# Patient Record
Sex: Male | Born: 1990
Health system: Southern US, Community
[De-identification: ages and names within clinical notes are randomized; demographics above are authoritative.]

## PROBLEM LIST (undated history)

## (undated) DIAGNOSIS — J9819 Other pulmonary collapse: Secondary | ICD-10-CM

## (undated) HISTORY — PX: CHEST TUBE INSERTION: SHX231

---
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
## 2008-05-07 ENCOUNTER — Emergency Department (HOSPITAL_BASED_OUTPATIENT_CLINIC_OR_DEPARTMENT_OTHER): Admission: EM | Admit: 2008-05-07 | Discharge: 2008-05-08 | Payer: Self-pay | Admitting: Emergency Medicine

## 2008-05-08 ENCOUNTER — Ambulatory Visit: Payer: Self-pay | Admitting: Thoracic Surgery

## 2008-05-09 ENCOUNTER — Ambulatory Visit: Payer: Self-pay | Admitting: Cardiothoracic Surgery

## 2008-05-09 ENCOUNTER — Ambulatory Visit (HOSPITAL_COMMUNITY): Admission: RE | Admit: 2008-05-09 | Discharge: 2008-05-09 | Payer: Self-pay | Admitting: Cardiothoracic Surgery

## 2008-05-16 ENCOUNTER — Ambulatory Visit: Payer: Self-pay | Admitting: Cardiothoracic Surgery

## 2008-05-16 ENCOUNTER — Ambulatory Visit (HOSPITAL_COMMUNITY): Admission: RE | Admit: 2008-05-16 | Discharge: 2008-05-16 | Payer: Self-pay | Admitting: Cardiothoracic Surgery

## 2008-09-15 ENCOUNTER — Encounter: Payer: Self-pay | Admitting: Emergency Medicine

## 2008-09-15 ENCOUNTER — Inpatient Hospital Stay (HOSPITAL_COMMUNITY): Admission: EM | Admit: 2008-09-15 | Discharge: 2008-09-23 | Payer: Self-pay | Admitting: *Deleted

## 2008-09-15 ENCOUNTER — Ambulatory Visit: Payer: Self-pay | Admitting: Diagnostic Radiology

## 2008-09-15 ENCOUNTER — Ambulatory Visit: Payer: Self-pay | Admitting: Thoracic Surgery

## 2008-09-19 ENCOUNTER — Encounter: Payer: Self-pay | Admitting: Thoracic Surgery

## 2008-10-03 ENCOUNTER — Ambulatory Visit: Payer: Self-pay | Admitting: Thoracic Surgery

## 2008-10-03 ENCOUNTER — Encounter: Admission: RE | Admit: 2008-10-03 | Discharge: 2008-10-03 | Payer: Self-pay | Admitting: Thoracic Surgery

## 2009-01-26 ENCOUNTER — Emergency Department (HOSPITAL_BASED_OUTPATIENT_CLINIC_OR_DEPARTMENT_OTHER): Admission: EM | Admit: 2009-01-26 | Discharge: 2009-01-27 | Payer: Self-pay | Admitting: Emergency Medicine

## 2009-01-26 ENCOUNTER — Ambulatory Visit: Payer: Self-pay | Admitting: Diagnostic Radiology

## 2009-02-22 ENCOUNTER — Emergency Department (HOSPITAL_BASED_OUTPATIENT_CLINIC_OR_DEPARTMENT_OTHER): Admission: EM | Admit: 2009-02-22 | Discharge: 2009-02-23 | Payer: Self-pay | Admitting: Emergency Medicine

## 2010-01-02 IMAGING — CR DG CHEST 1V PORT
1 series · 1 of 1 positions shown · non-contrast
Comparison: 09/15/2008

CLINICAL DATA: Transfer from [HOSPITAL] [HOSPITAL].  Status post CT
placement

PORTABLE CHEST - 1 VIEW

[AP]
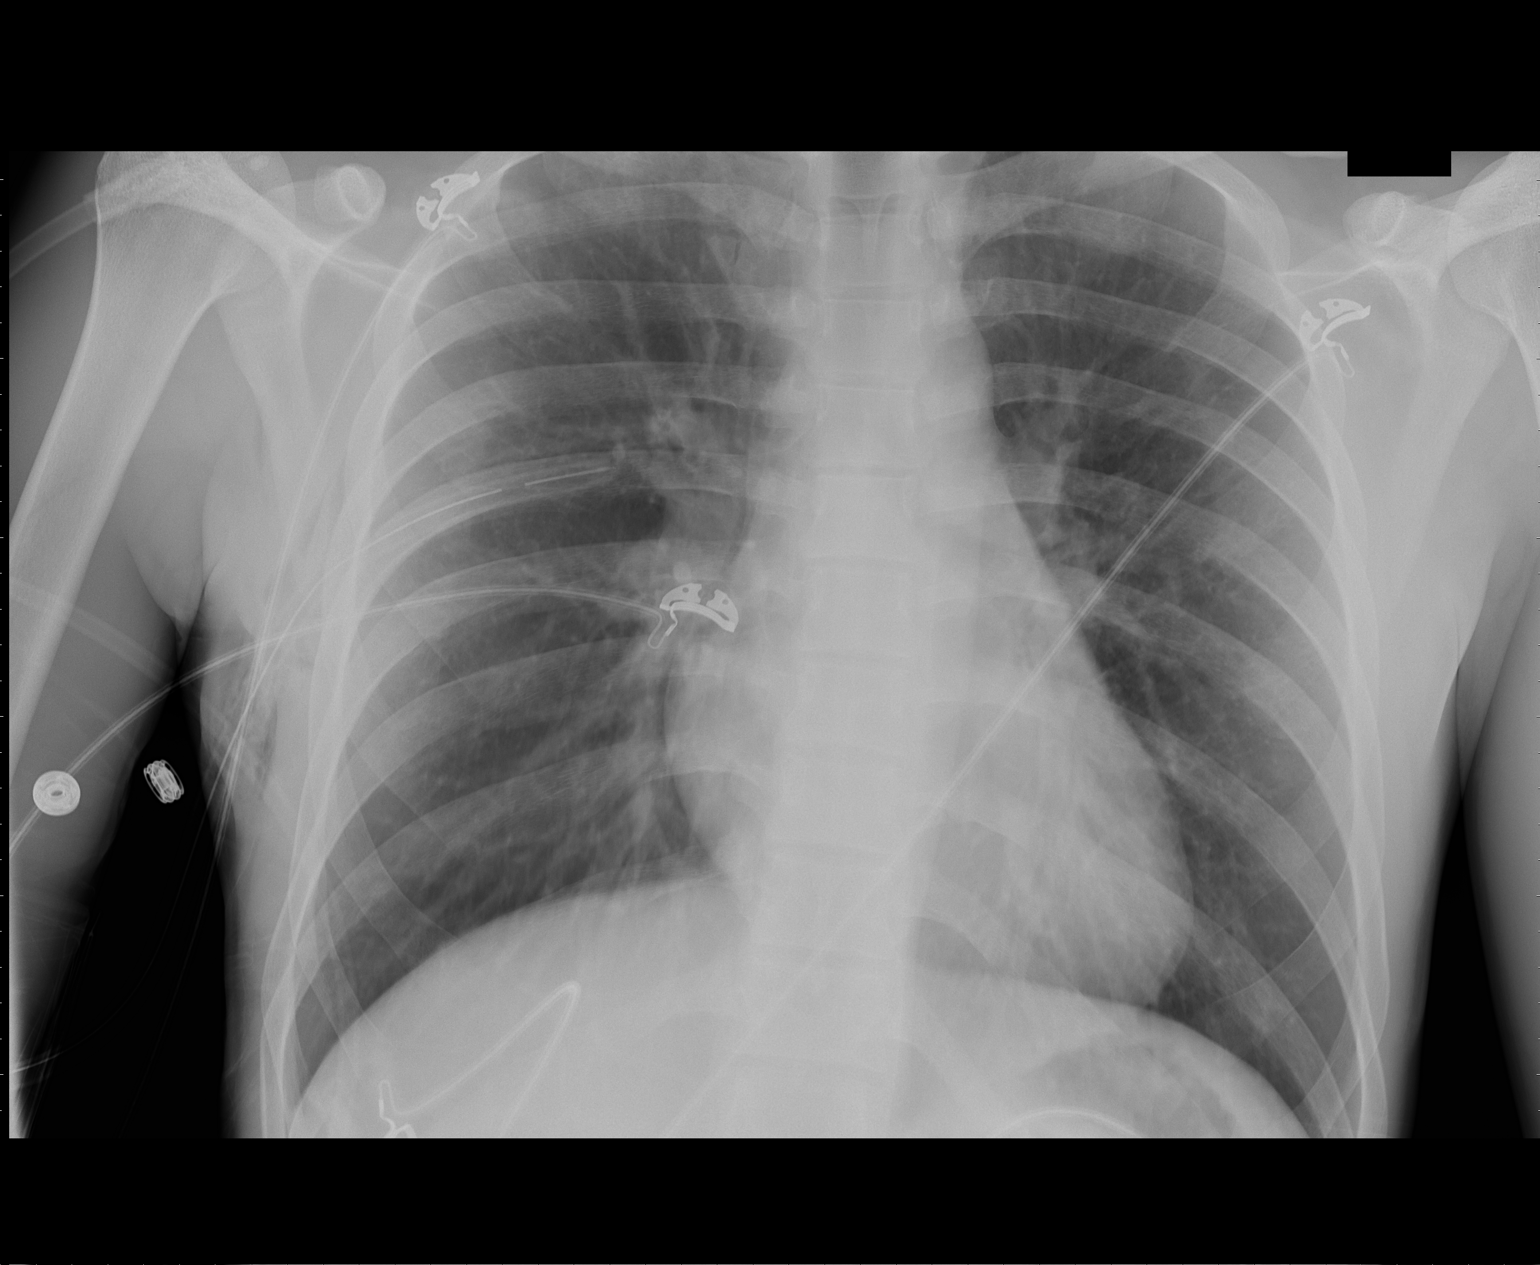

[1 of 1 positions shown; findings below may reference images not displayed]

FINDINGS: The patient has a right-sided chest tube.  There is
patient motion artifact.  Right pneumothorax is much smaller, now
estimated to measure less than 5% lung volume.  Heart size is
normal.  Left lung is essentially clear.
IMPRESSION: Interval placement of right-sided chest tube.  Small right
pneumothorax.

## 2010-01-09 IMAGING — CR DG CHEST 2V
2 series · 2 of 2 positions shown · non-contrast
Comparison: Portable chest x-ray of 09/21/2008

CLINICAL DATA: Spontaneous pneumothorax, follow-up

CHEST - 2 VIEW

[w chest pa]
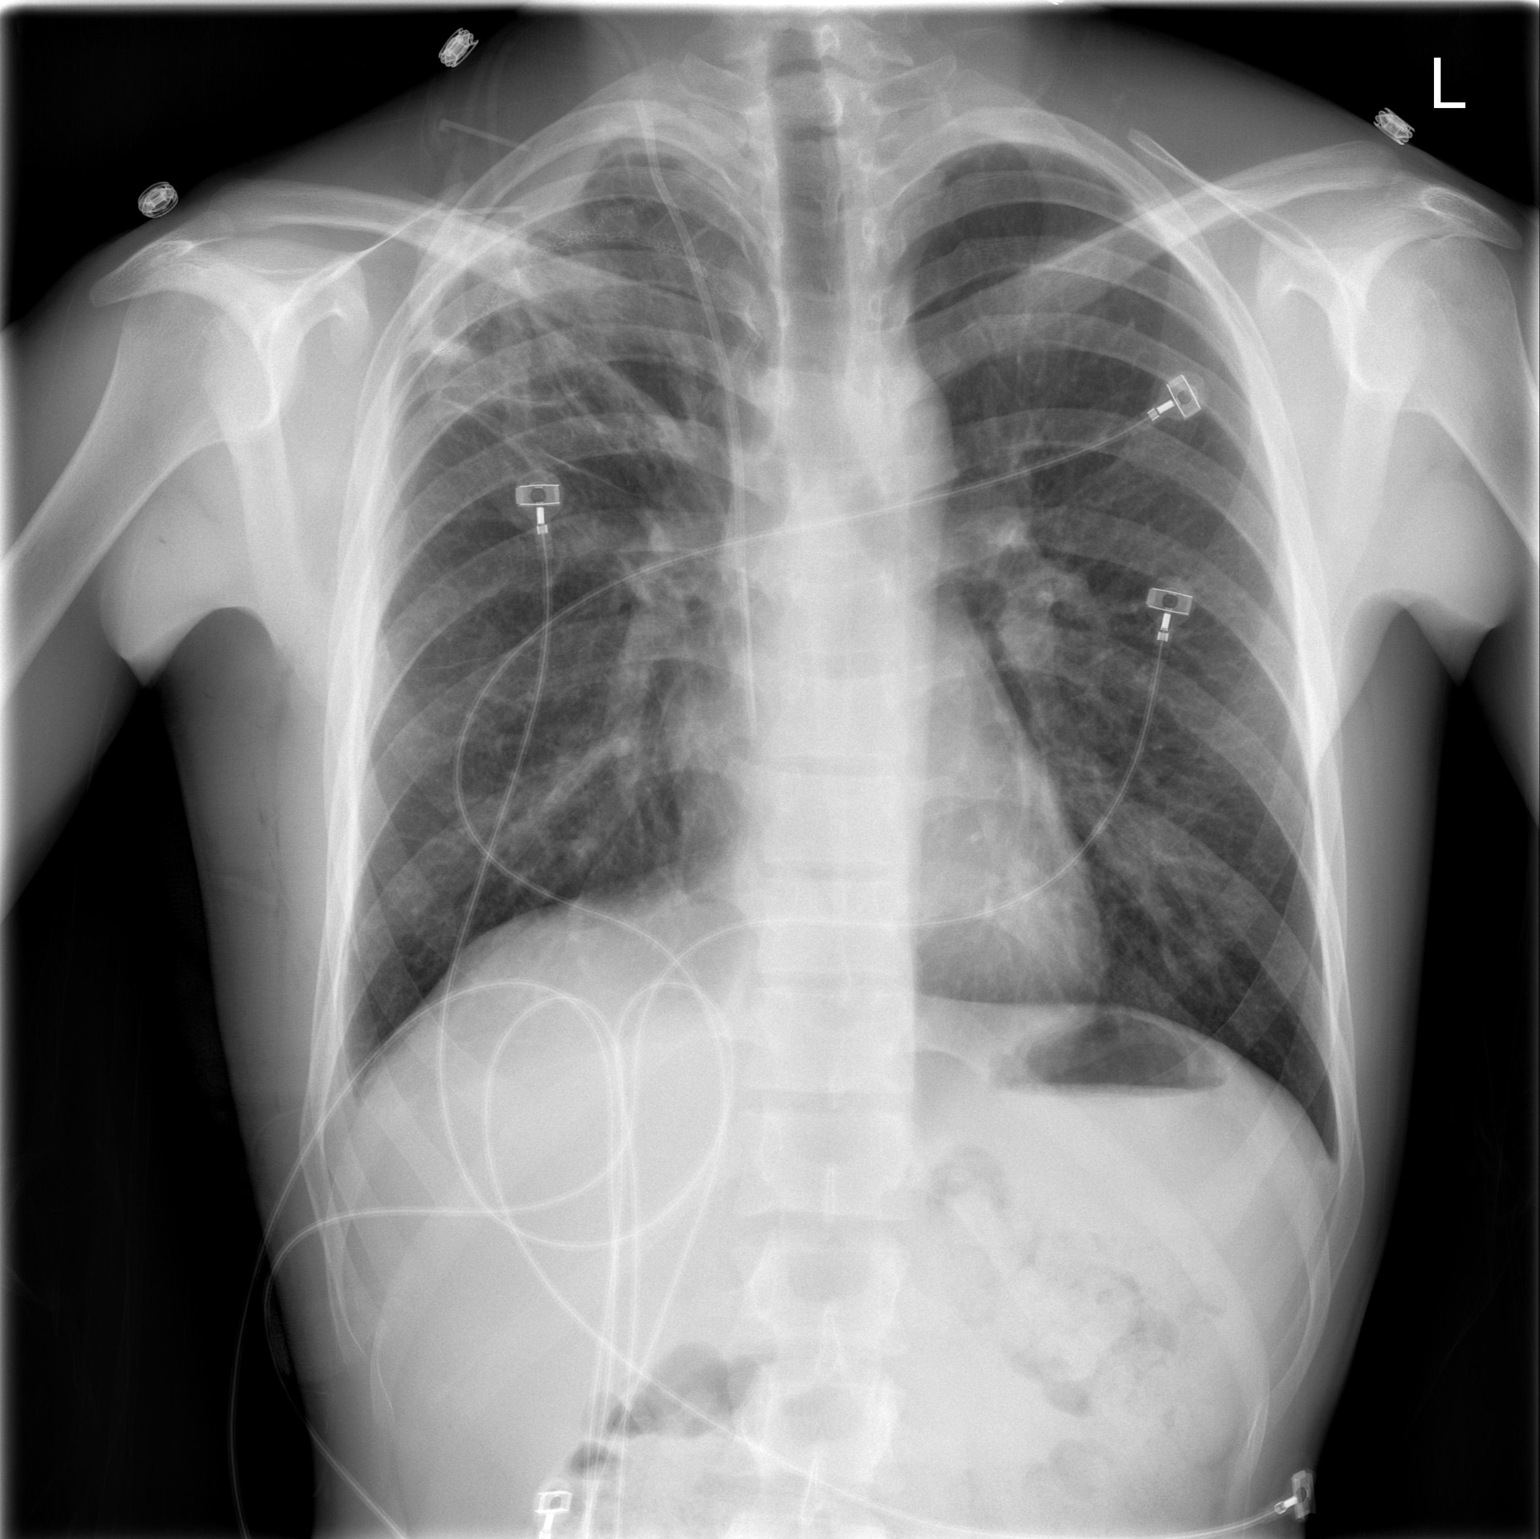

[w chest lat]
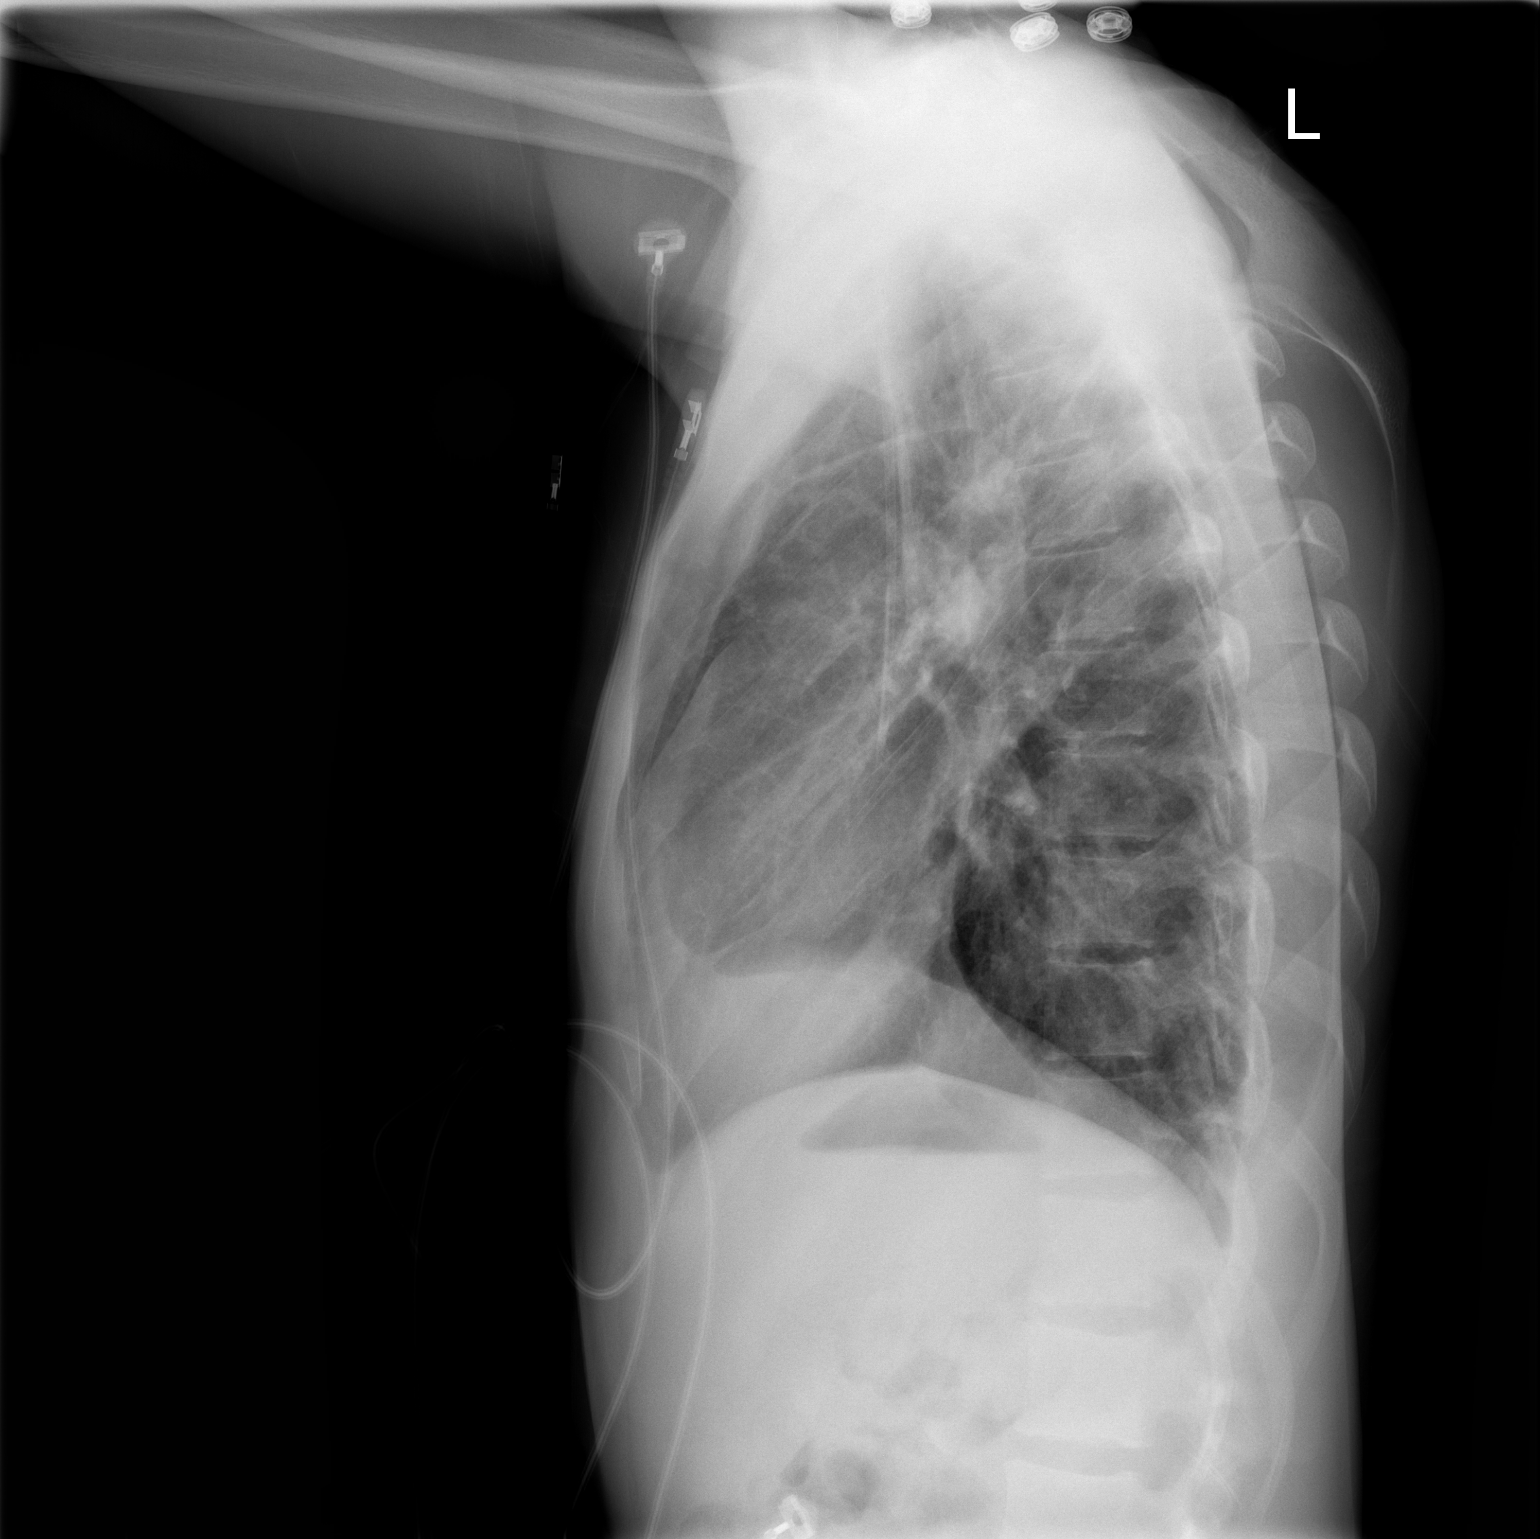

[2 of 2 positions shown; findings below may reference images not displayed]

FINDINGS: No pneumothorax is seen.  Postop change in the right
upper hemithorax is stable with volume loss, pleural opacity, and
sutures.  The left lung is clear.  Heart size is stable.  Right
central venous catheter remains with the tip at the SVC/RA
junction.
IMPRESSION: Stable postop change in the right upper hemithorax.  No definite
pneumothorax.

## 2010-01-10 IMAGING — CR DG CHEST 2V
2 series · 2 of 2 positions shown · non-contrast
Comparison: 09/22/2008

CLINICAL DATA: Pneumothorax ca post surgery

CHEST - 2 VIEW

[w chest pa]
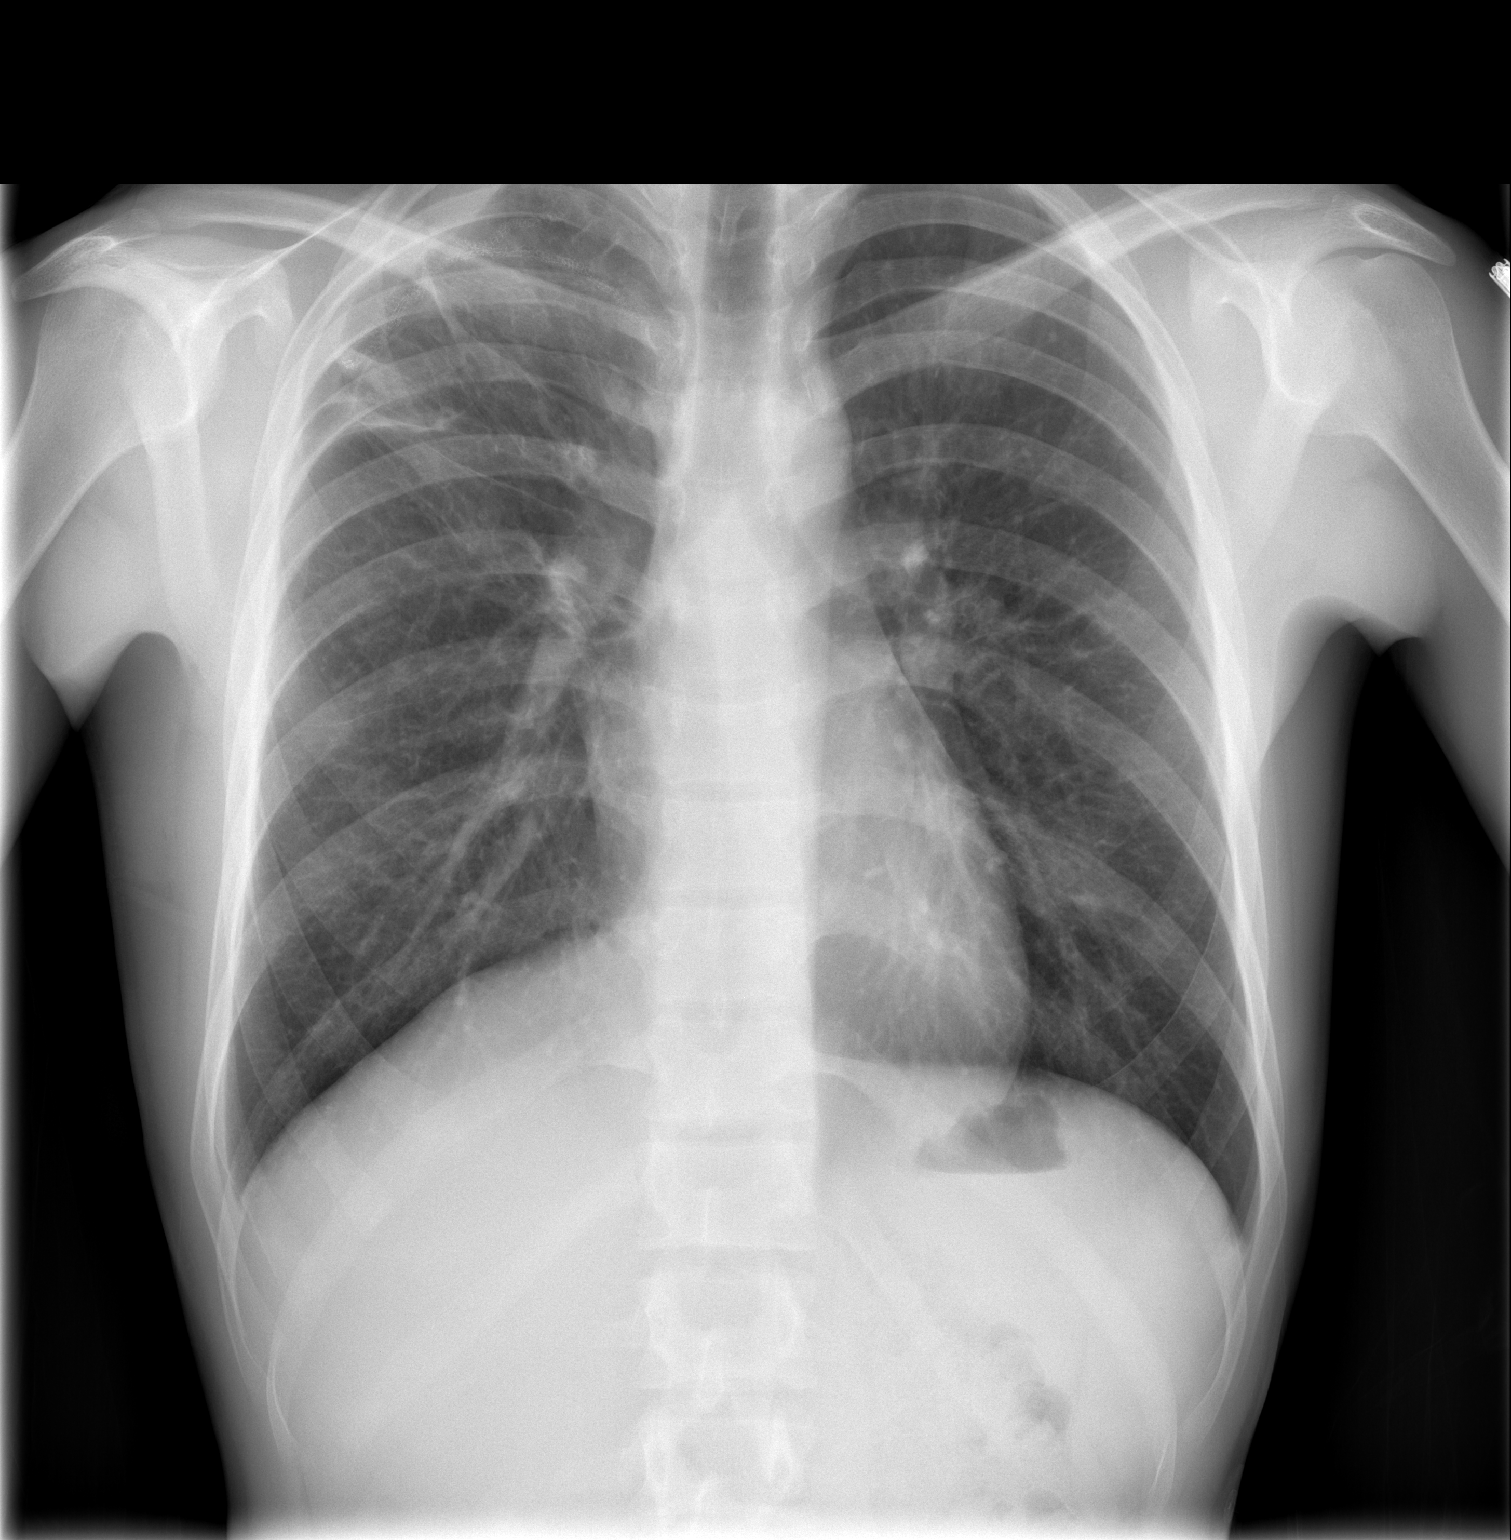

[w chest lat]
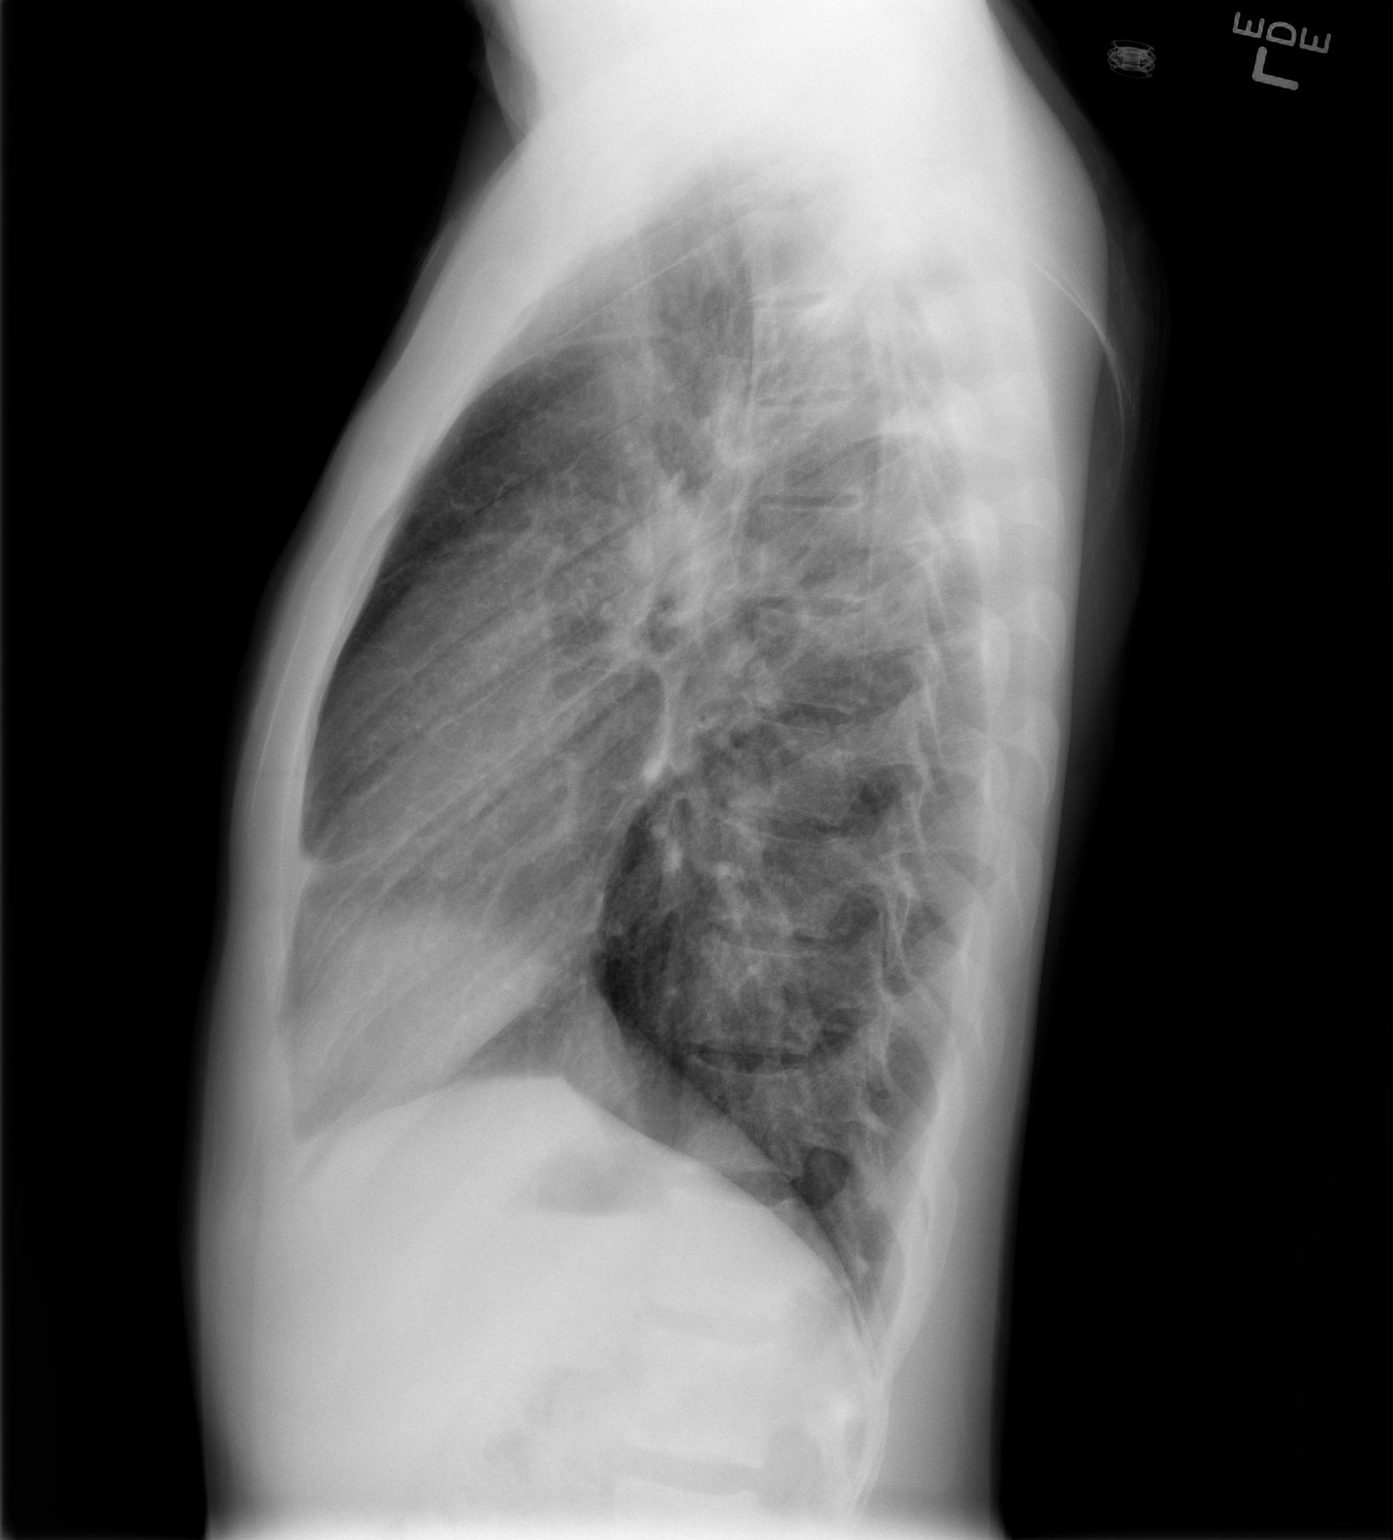

[2 of 2 positions shown; findings below may reference images not displayed]

FINDINGS: Postoperative changes in the right lung apex with
improving aeration.  No pneumothorax.  No new findings.  Lungs
otherwise clear.  Heart and mediastinal contours normal.
IMPRESSION: 1.  Postoperative changes in the right lung apex with improving
aeration.
2.  Lungs otherwise clear.
3.  No new findings.

## 2010-09-27 ENCOUNTER — Encounter: Payer: Self-pay | Admitting: Thoracic Surgery

## 2010-09-28 ENCOUNTER — Encounter: Payer: Self-pay | Admitting: Thoracic Surgery

## 2010-12-21 LAB — CBC
HCT: 43.9 % (ref 39.0–52.0)
HCT: 45.5 % (ref 39.0–52.0)
Hemoglobin: 14.9 g/dL (ref 13.0–17.0)
Hemoglobin: 15.2 g/dL (ref 13.0–17.0)
Hemoglobin: 14 g/dL (ref 13.0–17.0)
MCHC: 33.5 g/dL (ref 30.0–36.0)
MCHC: 33.9 g/dL (ref 30.0–36.0)
MCHC: 33.4 g/dL (ref 30.0–36.0)
MCV: 94 fL (ref 78.0–100.0)
MCV: 95.8 fL (ref 78.0–100.0)
Platelets: 210 10*3/uL (ref 150–400)
RBC: 4.46 MIL/uL (ref 4.22–5.81)
RDW: 13 % (ref 11.5–15.5)
RDW: 13.2 % (ref 11.5–15.5)
RDW: 12.9 % (ref 11.5–15.5)
WBC: 11.9 10*3/uL — ABNORMAL HIGH (ref 4.0–10.5)

## 2010-12-21 LAB — BASIC METABOLIC PANEL
CO2: 28 mEq/L (ref 19–32)
Glucose, Bld: 116 mg/dL — ABNORMAL HIGH (ref 70–99)
Potassium: 3.9 mEq/L (ref 3.5–5.1)
Sodium: 137 mEq/L (ref 135–145)

## 2010-12-21 LAB — DIFFERENTIAL
Eosinophils Absolute: 0.1 10*3/uL (ref 0.0–0.7)
Lymphs Abs: 1.9 10*3/uL (ref 0.7–4.0)
Monocytes Relative: 9 % (ref 3–12)
Neutro Abs: 5.5 10*3/uL (ref 1.7–7.7)
Neutrophils Relative %: 66 % (ref 43–77)

## 2010-12-21 LAB — COMPREHENSIVE METABOLIC PANEL
ALT: 25 U/L (ref 0–53)
Albumin: 3.2 g/dL — ABNORMAL LOW (ref 3.5–5.2)
BUN: 6 mg/dL (ref 6–23)
Calcium: 8.9 mg/dL (ref 8.4–10.5)
Chloride: 99 mEq/L (ref 96–112)
Creatinine, Ser: 0.87 mg/dL (ref 0.4–1.5)
GFR calc Af Amer: >60 mL/min (ref >60)
Glucose, Bld: 95 mg/dL (ref 70–99)
Glucose, Bld: 99 mg/dL (ref 70–99)
Sodium: 136 mEq/L (ref 135–145)
Total Bilirubin: 0.3 mg/dL (ref 0.3–1.2)
Total Protein: 6.4 g/dL (ref 6.0–8.3)
Total Protein: 6.7 g/dL (ref 6.0–8.3)

## 2010-12-21 LAB — BLOOD GAS, ARTERIAL
Acid-Base Excess: 2.7 mmol/L — ABNORMAL HIGH (ref 0.0–2.0)
Patient temperature: 98.6
TCO2: 28.8 mmol/L (ref 0–100)

## 2011-01-19 NOTE — Assessment & Plan Note (Signed)
OFFICE VISIT   Keith Morse, Keith Morse  DOB:  1991-03-28                                        May 08, 2008  CHART #:  [id number]   The patient was seen in the emergency room at [facility] last night  complaining of bilateral chest pain.  This 20 year old patient had the  onset of bilateral chest pain on inspiration with a CT scan initially  shows a left small pneumothorax.  He was observed in the emergency room  overnight.  A followup CT scan showed a small right pneumothorax, which  would go along with probably a small leak from apical blebs with some  communication between the left and the right pleural space more likely  the leak was only on the left side.  He smokes 2 packs of cigarettes a  day.  He recently hit his hand hitting the wall resulting in problems  with his right wrist in which he wears a splint.  Otherwise, he has been  in good health.  He had no allergies.  He is on no medications except he  was given hydrocodone and Percocet at the emergency room and is little  draggy at the present time from the medications.   FAMILY HISTORY:  Noncontributory.   SOCIAL HISTORY:  He is single.  Smokes 2 packs a day.  No alcohol  intake.   REVIEW OF SYSTEMS:  No chest pain or shortness of breath.  No history of  murmur.  PULMONARY:  See history of present illness.  GI:  No nausea,  vomiting, constipation, or diarrhea.  GU:  No kidney disease, dysuria,  or frequent urination.  VASCULAR:  No claudication, DVT, or TIAs.  NEUROLOGIC:  He has had dizziness.  Musculoskeletal:  No joint pain.  PSYCHIATRIC:  Medically she has been treated with depression and  nervousness.  EYE/ENT:  No recent decrease in his eyesight and no  changes in hearing.  HEMATOLOGIC:  No problems with bleeding or clotting  disorders.   PHYSICAL EXAMINATION:  GENERAL:  A thin Caucasian male, in no acute  distress.  HEAD, EYES, EARS, NOSE, AND THROAT:  Unremarkable.  His blood  pressure  was 120/81, pulse 76, respirations 18, and sats were 99%.  LUNGS:  Clear to auscultation and percussion.  HEART:  Regular sinus rhythm.  No murmur.  ABDOMEN:  Soft.  There is no hepatosplenomegaly.  EXTREMITIES:  Pulses 2+.  There is no clubbing or edema.  NEUROLOGIC:  He is oriented x3.  Sensory and motor intact.   I explained to he and his family that this can be observed and then I  will have him come back to see Dr. Tyrone Sage tomorrow and to let us know  if his shortness of breath gets worse and to take as minimal the pain  medications if possible and just to remain easy at that time, just take  it minimal activity until we see him again tomorrow.  I did explain to  him should this progress, he may require chest tube and/or surgery.   I appreciate the opportunity of seeing the patient.   Ines Bloomer, M.D.  Electronically Signed   DPB/MEDQ  D:  05/08/2008  T:  05/08/2008  Job:  [id number]

## 2011-01-19 NOTE — Op Note (Signed)
NAMECLEARNCE, LEJA NO.:  [id number]   MEDICAL RECORD NO.:  [id number]          PATIENT TYPE:  INP   LOCATION:  1824                         FACILITY:  [facility]   PHYSICIAN:  Ines Bloomer, M.D. DATE OF BIRTH:  May 09, 1991   DATE OF PROCEDURE:  DATE OF DISCHARGE:                               OPERATIVE REPORT   PREOPERATIVE DIAGNOSIS:  Right pneumothorax.   POSTOPERATIVE DIAGNOSIS:  Right pneumothorax.   OPERATION PERFORMED:  Insertion of right chest tube.   SURGEON:  Ines Bloomer, MD   ANESTHESIA:  Versed 5 mg and IV sedation, 2 mg of morphine, and 1%  Xylocaine.   After prepping and draping the right chest, area was infiltrated with 1%  Xylocaine over the fifth intercostal space at the midaxillary line.  A 1-  cm incision was made and 2 chest tube sutures with #0 silk were  inserted.  Dissection was then carried down to the pleura and pleura was  entered and a #20 chest tube was inserted without difficulty.  Chest  tube was sutured place with 0-silk.  Sterile dressing was applied.  The  patient returned to recovery room in stable condition.      Ines Bloomer, M.D.  Electronically Signed     DPB/MEDQ  D:  09/15/2008  T:  09/15/2008  Job:  [id number]

## 2011-01-19 NOTE — Assessment & Plan Note (Signed)
OFFICE VISIT   KEMAL, AMORES  DOB:  Oct 26, 1990                                        October 03, 2008  CHART #:  [id number]   The patient came today.  We removed his chest tube stitches.  Blood  pressure was 122/82, pulse 83, respirations 18, and sats were 95%.  I  told him gradually to increase his activities.  His chest x-ray looks  good with no evidence of recurrence of his pneumothorax.  We will see  him back again in 6 weeks with a chest x-ray for a final check.   Ines Bloomer, M.D.  Electronically Signed   DPB/MEDQ  D:  10/03/2008  T:  10/03/2008  Job:  [id number]

## 2011-01-19 NOTE — Assessment & Plan Note (Signed)
OFFICE VISIT   Keith Morse, Keith Morse  DOB:  10-15-1990                                        May 16, 2008  CHART #:  [id number]   The patient returns to the office today for a followup chest x-ray.  The  10 days ago, he was noted to have a spontaneous pneumothorax and  followup films showed small bilateral pneumothoraces, asymptomatic.  The  patient was seen last week in the office and then asked to return this  week with a followup chest x-ray to ensure complete resolution.  Since  last seen, he has been asymptomatic.  Unfortunately after her long  discussion last week about not smoking, he is continuing to smoke, but  had a much decreased rate.  I again reiterated with him the need to not  smoke and the chance of recurrence of spontaneous pneumothorax.   PHYSICAL EXAMINATION:  VITAL SIGNS:  His blood pressure is 125/70, pulse  is 83, respiratory rate is 18, and O2 sats 98%.  LUNGS:  Clear bilaterally.  There is no wheezing.  CARDIAC:  Sounds reveal regular rhythm without murmur or gallop.   Followup chest x-ray shows complete resolution of pneumothoraces.   IMPRESSION:  The patient with spontaneous pneumothorax, now resolved.  I  had again discussed with him the need to stop smoking.  He will continue  to make efforts to stop.  I will see him back p.r.n.  I had reviewed  with him the signs and symptoms of recurrent pneumothorax and should he  have these, to seek medical attention immediately.   Sheliah Plane, MD  Electronically Signed   EG/MEDQ  D:  05/16/2008  T:  05/17/2008  Job:  [id number]

## 2011-01-19 NOTE — Op Note (Signed)
NAMECARMERON, HEADY NO.:  [id number]   MEDICAL RECORD NO.:  [id number]          PATIENT TYPE:  INP   LOCATION:  2010                         FACILITY:  [facility]   PHYSICIAN:  Ines Bloomer, M.D. DATE OF BIRTH:  Jun 24, 1991   DATE OF PROCEDURE:  09/19/2008  DATE OF DISCHARGE:  09/23/2008                               OPERATIVE REPORT   PREOPERATIVE DIAGNOSES:  Bilateral pneumothoraces, recurrent right  pneumothorax, and history of tobacco abuse.   POSTOPERATIVE DIAGNOSES:  Bilateral pneumothoraces, recurrent right  pneumothorax, and history of tobacco abuse.   OPERATION:  Right video-assisted thoracoscopic surgery resection of  apical bleb, pleurectomy, and pleurodesis.   ANESTHESIA:  General anesthesia.   DESCRIPTION OF PROCEDURE:  After adequate general anesthesia, a dual-  lumen tube was inserted.  The patient turned in the right lateral  thoracotomy position.  A previous chest tube was inserted and this was  used as the trocar site.  That was in the anterior axillary line at the  fifth intercostal space.  A 0-degree scope was inserted and I could see  the apical blebs with the scope.  Another incision was made at the  posterior axillary line at the sixth intercostal space and at the mid  axillary line at the third intercostal space.  Through the anterior  axillary line, the blebs were grasped with a straight Kaiser ring  forceps and then coming in posteriorly this was resected with an  Autosuture and 45 Roticulator stapler with two applications.  Another  small area of bleb was also resected with the Autosuture stapler.  After  that had been done, we then did a pleurectomy of the upper five space  using Kaiser ring forceps and direct vision with the scope to remove all  the lesions.  Then we did a pleurodesis of the lower fibers using a  mechanical pleurodesis with Bovie scraper.  A chest tube was placed  through the fifth intercostal space and sutured in  place.  The other two  trocar sites were closed with 3-0 Vicryl.  The lung was re-expanded.  Dry sterile dressing applied.  The patient returned to recovery room in  stable condition.      Ines Bloomer, M.D.  Electronically Signed     DPB/MEDQ  D:  10/08/2008  T:  10/09/2008  Job:  [id number]

## 2011-01-19 NOTE — Discharge Summary (Signed)
Keith Morse, Keith Morse NO.:  [id number]   MEDICAL RECORD NO.:  [id number]          PATIENT TYPE:  INP   LOCATION:  2010                         FACILITY:  [facility]   PHYSICIAN:  Ines Bloomer, M.D. DATE OF BIRTH:  07/25/1991   DATE OF ADMISSION:  09/15/2008  DATE OF DISCHARGE:                               DISCHARGE SUMMARY   PRIMARY ADMITTING DIAGNOSIS:  Right-sided chest pain.   ADDITIONAL/DISCHARGE DIAGNOSES:  1. Spontaneous right pneumothorax.  2. History of tobacco abuse.   PROCEDURES PERFORMED:  1. Insertion of right chest tube.  2. Right video-assisted thoracic surgery, resection of apical blebs,      pleurectomy, and mechanical pleurodesis.   HISTORY:  The patient is a 20 year old male who had previously suffered  a left 10% pneumothorax in September 2009.  He did not require a chest  tube placement at that time and his pneumothorax resolved.  On the date  of this admission, he developed right-sided chest pain while smoking and  presented to the emergency department at [facility] where he was found  on chest x-ray to have a 25% pneumothorax.  He was complaining of  continued right chest pain with some shortness of breath and he was  subsequently transferred to [facility] for further evaluation and  treatment.  He was seen in the emergency department by Dr. Edwyna Shell.  It  was felt that a chest tube should be placed for resolution of his  pneumothorax.  He was subsequently admitted for chest tube management.   HOSPITAL COURSE:  The patient underwent placement of 20-French right  chest tube upon admission.  This was initially placed to suction and  showed improvement in his pneumothorax down to 5-10%.  However when he  was dropped to water-seal, his pneumothorax worsened.  A CT scan was  then performed, which showed apical blebs.  Dr. Edwyna Shell reviewed the  films and felt that he would best be served with a right VATS with  resection of blebs for prevention  of further issues on this side.  The  patient was taken to the operating room on September 19, 2008, and  underwent right VATS with resection of apical blebs as described in  detail above.  He tolerated the procedure well and was returned to the  floor in stable condition.  His postoperative course has been  uneventful.  His chest tube remained stable with no air leak.  He was  decreased to suction and chest tube was subsequently removed  approximately 48 hours after surgery.  A followup chest x-ray showed no  definite pneumothorax.  The patient has remained stable.  His pain is  controlled with oral pain medications.  He is ambulating without  difficulty and is tolerating a regular diet.  His incisions are healing  well.  He is maintaining O2 sats greater than 90% on room air.  His most  recent labs showed hemoglobin 14, hematocrit 41.9, white count 11.9,  platelets 260.  Sodium 139, potassium 4.4, BUN 6, creatinine 0.87.  He  will undergo a repeat PA and lateral chest x-ray on  the morning of  September 22, 2008, and this has remained stable and there are no other  acute changes.  He will hopefully be ready for discharge home.   DISCHARGE MEDICATIONS:  Vicodin 1-2 q.4 h. p.r.n. for pain.   DISCHARGE INSTRUCTIONS:  He is asked to refrain from driving, heavy  lifting, or strenuous activity.  He may continue ambulating daily and  using his incentive spirometer.  He may shower daily and clean his  incisions with soap and water.  He will continue his same preoperative  diet.   DISCHARGE FOLLOWUP:  He will see Dr. Edwyna Shell back in the office in a week  with a chest x-ray.  In the interim if he experiences any problems or  has questions, he is asked to contact our office.      Coral Ceo, P.A.      Ines Bloomer, M.D.  Electronically Signed    GC/MEDQ  D:  09/22/2008  T:  09/22/2008  Job:  [id number]

## 2011-01-19 NOTE — Assessment & Plan Note (Signed)
OFFICE VISIT   Keith Morse, Keith Morse  DOB:  November 29, 1990                                        May 09, 2008  CHART #:  [id number]   The patient returns to the office today for followup of chest x-ray.  He  recently, 5 days go had onset of left chest pain, 24 hours after this he  [facility]  Chest x-ray showed approximately 10%  left pneumothorax, question of even smaller right apical pneumothorax  with labs.  The patient was observed in the emergency room for about 12  hours.  A followup chest x-ray appeared stable.  He was then seen by Dr.  Edwyna Shell in office yesterday with a followup film again unchanged.  He  returns today.  He denies shortness of breath.  Denies chest pain.  Unfortunately, he continues to smoke, had been smoking up to a pack a  day.  He has now stopped that, decreased it to just several cigarettes  over the past several days.   On exam, his blood pressure is 118/77, pulse is 73, respiratory rate is  18, and O2 sats 98%.  On exam, he has no wheezing. His lungs are clear  bilaterally.   Followup chest x-ray was done today and appears unchanged from one done  yesterday.  If not with a decrease in size of both apical pneumo, again  strongly told the patient of the consequences of him continuing to smoke  both on a short term and long term, and he was again aware of symptoms  should he have further problems to come to the [facility] Emergency  Room.  I plan to see him back in one week with a followup chest x-ray to  make sure there is continuing resolution.   Sheliah Plane, MD  Electronically Signed   EG/MEDQ  D:  05/09/2008  T:  05/10/2008  Job:  [id number]

## 2011-01-19 NOTE — H&P (Signed)
Keith Morse, Keith Morse NO.:  [id number]   MEDICAL RECORD NO.:  [id number]          PATIENT TYPE:  INP   LOCATION:  1824                         FACILITY:  [facility]   PHYSICIAN:  Ines Bloomer, M.D. DATE OF BIRTH:  1990-11-09   DATE OF ADMISSION:  09/15/2008  DATE OF DISCHARGE:                              HISTORY & PHYSICAL   CHIEF COMPLAINT:  Right chest pain.   HISTORY OF PRESENT ILLNESS:  This is a 20 year old Caucasian male smoker  who developed left chest pain in September, was found to have a 10%  pneumothorax, which resolved.  Tonight while smoking, he developed a  right chest pain, went to the emergency room at [facility] and was found  to have a 25% pneumothorax at chest x-ray, was transferred to the [facility]  Emergency Room for insertion of a chest tube.  He has had no fever or  chills.  No hemoptysis.  No excessive sputum.  No recent history of  asthma.   MEDICATIONS:  He is no medications.   ALLERGIES:  He has no allergies.   PAST MEDICAL HISTORY:  Many years ago, he had a laceration on the nose,  which underwent suturing.  He has a history of palpitations.  Normal  childhood diseases.   FAMILY HISTORY:  Noncontributory.   SOCIAL HISTORY:  He is single.  He smokes one pack plus per day.  He is  coming today with his mother and father.   REVIEW OF SYSTEMS:  CARDIAC:  Palpitations.  GENERAL:  Denies weight  loss.  No change in his weight.  PULMONARY:  See history of present  illness.  GI:  No nausea, vomiting, constipation, diarrhea.  GU:  No  kidney disease or frequent urination.  VASCULAR:  No claudication, DVT,  or TIAs.  NEUROLOGIC:  No dizziness, headaches, blackout, or seizures.  MUSCULOSKELETAL:  No arthritis.  PSYCHIATRIC:  No psychiatric disease.  HEMATOLOGICAL:  No problems with bleeding or clotting disorders.  HEENT:  No change in his eyesight or hearing.   PHYSICAL EXAMINATION:  VITAL SIGNS:  His blood pressure is 110/80, pulse  94,  respirations 18, sats were 96%.  HEENT:  Head is atraumatic.  Eyes, pupils equal and reactive to light  and accommodation.  Extraocular movements are normal.  Ears, tympanic  membranes are intact.  Nose, there is a scar on the bridge of his nose.  There is no septal deviation.  Throat is without lesion.  NECK:  Supple without thyromegaly.  There is no supraclavicular or  axillary adenopathy.  CHEST:  Decreased breath sounds on the right.  Normal breath sounds on  the left.  HEART:  Regular sinus rhythm.  No murmurs.  ABDOMEN:  Soft.  There is no hepatosplenomegaly.  EXTREMITIES:  Pulse of 2+.  There is no clubbing or edema.  NEUROLOGIC:  He is oriented x3.  Sensory and motor intact.  Cranial  nerves intact.   IMPRESSION:  1. Right pneumothorax.  2. History of tobacco abuse.  3. History of left pneumothorax.   PLAN:  Insertion of left chest tube, #20  chest tube.      Ines Bloomer, M.D.  Electronically Signed     DPB/MEDQ  D:  09/15/2008  T:  09/15/2008  Job:  [id number]

## 2018-11-30 DIAGNOSIS — J9801 Acute bronchospasm: Secondary | ICD-10-CM | POA: Diagnosis not present

## 2019-05-24 DIAGNOSIS — R072 Precordial pain: Secondary | ICD-10-CM | POA: Diagnosis not present

## 2019-05-24 DIAGNOSIS — M940 Chondrocostal junction syndrome [Tietze]: Secondary | ICD-10-CM | POA: Diagnosis not present

## 2019-05-24 DIAGNOSIS — I498 Other specified cardiac arrhythmias: Secondary | ICD-10-CM | POA: Diagnosis not present

## 2019-05-24 DIAGNOSIS — R079 Chest pain, unspecified: Secondary | ICD-10-CM | POA: Diagnosis not present

## 2020-02-11 ENCOUNTER — Encounter (HOSPITAL_BASED_OUTPATIENT_CLINIC_OR_DEPARTMENT_OTHER): Payer: Self-pay

## 2020-02-11 ENCOUNTER — Emergency Department (HOSPITAL_BASED_OUTPATIENT_CLINIC_OR_DEPARTMENT_OTHER)
Admission: EM | Admit: 2020-02-11 | Discharge: 2020-02-12 | Disposition: A | Discharge status: Home / Self Care | Payer: BC Managed Care – PPO | Attending: Emergency Medicine | Admitting: Emergency Medicine

## 2020-02-11 ENCOUNTER — Other Ambulatory Visit: Payer: Self-pay

## 2020-02-11 DIAGNOSIS — M79602 Pain in left arm: Secondary | ICD-10-CM

## 2020-02-11 DIAGNOSIS — R42 Dizziness and giddiness: Secondary | ICD-10-CM | POA: Insufficient documentation

## 2020-02-11 DIAGNOSIS — R9431 Abnormal electrocardiogram [ECG] [EKG]: Secondary | ICD-10-CM | POA: Diagnosis not present

## 2020-02-11 DIAGNOSIS — R202 Paresthesia of skin: Secondary | ICD-10-CM | POA: Diagnosis not present

## 2020-02-11 DIAGNOSIS — F17210 Nicotine dependence, cigarettes, uncomplicated: Secondary | ICD-10-CM | POA: Diagnosis not present

## 2020-02-11 HISTORY — DX: Other pulmonary collapse: J98.19

## 2020-02-11 LAB — CBC
HCT: 45.9 % (ref 39.0–52.0)
Hemoglobin: 15.5 g/dL (ref 13.0–17.0)
MCH: 31.6 pg (ref 26.0–34.0)
MCHC: 33.8 g/dL (ref 30.0–36.0)
MCV: 93.7 fL (ref 80.0–100.0)
Platelets: 237 10*3/uL (ref 150–400)
RBC: 4.9 MIL/uL (ref 4.22–5.81)
RDW: 12.9 % (ref 11.5–15.5)
WBC: 11.3 10*3/uL — ABNORMAL HIGH (ref 4.0–10.5)
nRBC: 0 % (ref 0.0–0.2)

## 2020-02-11 NOTE — ED Triage Notes (Addendum)
Pt c/o numbness to bilat UE and bilat LE started ~5pm-states he felt he injured left UE ~2 weeks ago-no injury to other c/o sites-states he was seen at [facility]-dx with "like a pinched nerve"-started rx prednisone x 1tab-states dizziness started ~7pm-denies pain-NAD-steady gait-pt added he feels he has started drinking too much alcohol and is "worried about my heart and my liver"-denies any new stressors/concerns and denies SI/HI

## 2020-02-11 NOTE — ED Provider Notes (Signed)
[place] EMERGENCY DEPARTMENT Provider Note  CSN: [id number] Arrival date & time: 02/11/20 2129  Chief Complaint(s) Numbness  HPI Keith Morse is a 29 y.o. male   HPI CC: numbness and tingling  Onset/Duration: 2 days Timing: intermittent Location: BUE and BLE Severity: mild to moderate Modifying Factors:  Improved by: prednisone rxd by UC yesterday (for BUE numbness)  Worsened by: nothing Associated Signs/Symptoms:  Pertinent (+): lightheadedness  Pertinent (-): fever, chills, headache, chest pain, sob, n/v/d, abd pain, recent infection, trauma  Admitted to almost daily EtOH use.  Past Medical History Past Medical History:  Diagnosis Date   Collapsed lung    There are no problems to display for this patient.  Home Medication(s) Prior to Admission medications   Not on File                                                                                                                                    Past Surgical History Past Surgical History:  Procedure Laterality Date   CHEST TUBE INSERTION     Family History No family history on file.  Social History Social History   Tobacco Use   Smoking status: Current Every Day Smoker    Types: Cigarettes   Smokeless tobacco: Never Used  Substance Use Topics   Alcohol use: Yes    Comment: weekly   Drug use: Not Currently   Allergies Patient has no known allergies.  Review of Systems Review of Systems All other systems are reviewed and are negative for acute change except as noted in the HPI  Physical Exam Vital Signs  I have reviewed the triage vital signs BP (!) 141/106 (BP Location: Left Arm)    Pulse 90    Temp 98.7 F (37.1 C) (Oral)    Resp 18    Ht 5\' 10"  (1.778 m)    Wt 77.1 kg    SpO2 100%    BMI 24.39 kg/m   Physical Exam Vitals reviewed.  Constitutional:      General: He is not in acute distress.    Appearance: He is well-developed. He is not diaphoretic.  HENT:      Head: Normocephalic and atraumatic.     Nose: Nose normal.  Eyes:     General: No scleral icterus.       Right eye: No discharge.        Left eye: No discharge.     Conjunctiva/sclera: Conjunctivae normal.     Pupils: Pupils are equal, round, and reactive to light.  Neck:     Comments: Spurling's negative Cardiovascular:     Rate and Rhythm: Normal rate and regular rhythm.     Heart sounds: No murmur. No friction rub. No gallop.   Pulmonary:     Effort: Pulmonary effort is normal. No respiratory distress.     Breath sounds: Normal breath sounds. No stridor. No rales.  Abdominal:  General: There is no distension.     Palpations: Abdomen is soft.     Tenderness: There is no abdominal tenderness.  Musculoskeletal:        General: No tenderness.     Cervical back: Normal range of motion and neck supple. No spinous process tenderness or muscular tenderness.  Skin:    General: Skin is warm and dry.     Findings: No erythema or rash.  Neurological:     Mental Status: He is alert and oriented to person, place, and time.     Comments: Mental Status:  Alert and oriented to person, place, and time.  Attention and concentration normal.  Speech clear.  Recent memory is intact  Cranial Nerves:  II Visual Fields: Intact to confrontation. Visual fields intact. III, IV, VI: Pupils equal and reactive to light and near. Full eye movement without nystagmus  V Facial Sensation: Normal. No weakness of masticatory muscles  VII: No facial weakness or asymmetry  VIII Auditory Acuity: Grossly normal  IX/X: The uvula is midline; the palate elevates symmetrically  XI: Normal sternocleidomastoid and trapezius strength  XII: The tongue is midline. No atrophy or fasciculations.   Motor System: Muscle Strength: 5/5 and symmetric in the upper and lower extremities. No pronation or drift.  Muscle Tone: Tone and muscle bulk are normal in the upper and lower extremities.   Reflexes: DTRs: 1+ and  symmetrical in all four extremities. No Clonus Coordination: Intact finger-to-nose. No tremor.  Sensation: Intact to light touch..  Gait: Routine gait normal.      ED Results and Treatments Labs (all labs ordered are listed, but only abnormal results are displayed) Labs Reviewed  CBC - Abnormal; Notable for the following components:      Result Value   WBC 11.3 (*)    All other components within normal limits  COMPREHENSIVE METABOLIC PANEL - Abnormal; Notable for the following components:   Glucose, Bld 100 (*)    Total Protein 8.3 (*)    All other components within normal limits                                                                                                                         EKG  EKG Interpretation  Date/Time:  Monday February 11 2020 23:40:32 EDT Ventricular Rate:  76 PR Interval:    QRS Duration: 95 QT Interval:  373 QTC Calculation: 420 R Axis:   26 Text Interpretation: Sinus rhythm No significant change since last tracing Confirmed by Drema Pry [phone]) on 02/12/2020 4:08:30 AM      Radiology No results found.  Pertinent labs & imaging results that were available during my care of the patient were reviewed by me and considered in my medical decision making (see chart for details).  Medications Ordered in ED Medications - No data to display  Procedures Procedures  (including critical care time)  Medical Decision Making / ED Course I have reviewed the nursing notes for this encounter and the patient's prior records (if available in EHR or on provided paperwork).   Keith Morse was evaluated in Emergency Department on 02/12/2020 for the symptoms described in the history of present illness. He was evaluated in the context of the global COVID-19 pandemic, which necessitated consideration that the patient might be at risk  for infection with the SARS-CoV-2 virus that causes COVID-19. Institutional protocols and algorithms that pertain to the evaluation of patients at risk for COVID-19 are in a state of rapid change based on information released by regulatory bodies including the CDC and federal and state organizations. These policies and algorithms were followed during the patient's care in the ED.  Here for paresthesia improved with prednisone. No trauma No infectious symptoms. No neuro deficits concerning for cord compression. Admits to frequent alcohol use - not in WD. No significant electrolyte derangements       Final Clinical Impression(s) / ED Diagnoses Final diagnoses:  Paresthesia and pain of both upper extremities   The patient appears reasonably screened and/or stabilized for discharge and I doubt any other medical condition or other [facility] requiring further screening, evaluation, or treatment in the ED at this time prior to discharge. Safe for discharge with strict return precautions.  Disposition: Discharge  Condition: Good  I have discussed the results, Dx and Tx plan with the patient/family who expressed understanding and agree(s) with the plan. Discharge instructions discussed at length. The patient/family was given strict return precautions who verbalized understanding of the instructions. No further questions at time of discharge.    ED Discharge Orders    None      Follow Up: Primary care provider  Schedule an appointment as soon as possible for a visit  If you do not have a primary care physician, contact [facility] at [phone] for referral      This chart was dictated using voice recognition software.  Despite best efforts to proofread,  errors can occur which can change the documentation meaning.   Nira Conn, MD 02/12/20 [phone]

## 2020-02-12 LAB — COMPREHENSIVE METABOLIC PANEL
ALT: 42 U/L (ref 0–44)
AST: 29 U/L (ref 15–41)
Albumin: 4.7 g/dL (ref 3.5–5.0)
Alkaline Phosphatase: 95 U/L (ref 38–126)
Anion gap: 10 (ref 5–15)
BUN: 18 mg/dL (ref 6–20)
CO2: 27 mmol/L (ref 22–32)
Calcium: 9.9 mg/dL (ref 8.9–10.3)
Chloride: 103 mmol/L (ref 98–111)
Creatinine, Ser: 0.84 mg/dL (ref 0.61–1.24)
GFR calc Af Amer: >60 mL/min (ref >60)
GFR calc non Af Amer: >60 mL/min (ref >60)
Glucose, Bld: 100 mg/dL — ABNORMAL HIGH (ref 70–99)
Potassium: 4.5 mmol/L (ref 3.5–5.1)
Sodium: 140 mmol/L (ref 135–145)
Total Bilirubin: 0.9 mg/dL (ref 0.3–1.2)
Total Protein: 8.3 g/dL — ABNORMAL HIGH (ref 6.5–8.1)

## 2020-02-27 ENCOUNTER — Ambulatory Visit (INDEPENDENT_AMBULATORY_CARE_PROVIDER_SITE_OTHER): Payer: BC Managed Care – PPO | Admitting: Medical

## 2020-02-27 ENCOUNTER — Other Ambulatory Visit: Payer: Self-pay

## 2020-02-27 VITALS — BP 138/87 | HR 91 | Temp 97.9°F | Resp 18 | Ht 70.0 in | Wt 175.2 lb

## 2020-02-27 DIAGNOSIS — F1010 Alcohol abuse, uncomplicated: Secondary | ICD-10-CM | POA: Diagnosis not present

## 2020-02-27 DIAGNOSIS — Z1152 Encounter for screening for COVID-19: Secondary | ICD-10-CM

## 2020-02-27 DIAGNOSIS — M25522 Pain in left elbow: Secondary | ICD-10-CM

## 2020-02-27 DIAGNOSIS — R5383 Other fatigue: Secondary | ICD-10-CM

## 2020-02-27 DIAGNOSIS — Z0000 Encounter for general adult medical examination without abnormal findings: Secondary | ICD-10-CM | POA: Diagnosis not present

## 2020-02-27 DIAGNOSIS — F17200 Nicotine dependence, unspecified, uncomplicated: Secondary | ICD-10-CM

## 2020-02-27 MED ORDER — BUPROPION HCL ER (XL) 150 MG PO TB24
150.0000 mg | ORAL_TABLET | Freq: Every day | ORAL | 3 refills | Status: DC
Start: 2020-02-27 — End: 2024-05-21

## 2020-02-27 NOTE — Addendum Note (Signed)
Addended by: Gwenevere Abbot on: 02/27/2020 06:07 PM   Modules accepted: Level of Service

## 2020-02-27 NOTE — Patient Instructions (Addendum)
For you wellness exam today I have ordered cbc, cmp b12, b1 and lipid panel.future fasting labs.   Recommend exercise and healthy diet.  Cut back on soda significantly.  We will let you know lab results as they come in.  For smoking cessation, I prescribed Wellbutrin.  Rx advisement given.  For alcohol abuse, recommend cutting back significantly or stopping completely.    For left elbow pain/lateral epicondylitis type presentation refer to sports medicine.  Screening Covid IgG antibodies when you get wellness labs done.  Follow up date appointment will be determined after lab review.

## 2020-02-27 NOTE — Progress Notes (Signed)
Subjective:    Patient ID: Keith Morse, male    DOB: 09-11-1990, 29 y.o.   MRN: [id number]  HPI  Pt in for first time.  He has no pcp. Pt from [place] orignially.   Pt works [employer]. No exercise apart from work. Admits does not eat healthy. 2-20 oz  sodas a day.(mountain dew). Pack a day. Smoked for 10 years. Alcohol- 6 pack almost every day.  Pt tried to quite smoking. Was successful for about 2-3 weeks.   No known chronic medical medical problems.  Pt curious if he ever had covid.   Review of Systems  Constitutional: Negative for chills, fatigue and fever.  Respiratory: Negative for cough, chest tightness, shortness of breath and wheezing.   Cardiovascular: Negative for chest pain and palpitations.  Gastrointestinal: Negative for abdominal pain, anal bleeding, constipation, diarrhea and nausea.  Musculoskeletal: Negative for back pain.  Skin: Negative for rash.  Neurological: Negative for dizziness and headaches.  Hematological: Negative for adenopathy. Does not bruise/bleed easily.  Psychiatric/Behavioral: Negative for behavioral problems, confusion and dysphoric mood. The patient is not nervous/anxious.     Past Medical History:  Diagnosis Date  . Collapsed lung      Social History   Socioeconomic History  . Marital status: Single    Spouse name: Not on file  . Number of children: Not on file  . Years of education: Not on file  . Highest education level: Not on file  Occupational History  . Not on file  Tobacco Use  . Smoking status: Current Every Day Smoker    Types: Cigarettes  . Smokeless tobacco: Never Used  Vaping Use  . Vaping Use: Never used  Substance and Sexual Activity  . Alcohol use: Yes    Comment: weekly  . Drug use: Not Currently  . Sexual activity: Not on file  Other Topics Concern  . Not on file  Social History Narrative  . Not on file   Social Determinants of Health   Financial Resource Strain:   . Difficulty of Paying  Living Expenses:   Food Insecurity:   . Worried About Programme researcher, broadcasting/film/video in the Last Year:   . Barista in the Last Year:   Transportation Needs:   . Freight forwarder (Medical):   Marland Kitchen Lack of Transportation (Non-Medical):   Physical Activity:   . Days of Exercise per Week:   . Minutes of Exercise per Session:   Stress:   . Feeling of Stress :   Social Connections:   . Frequency of Communication with Friends and Family:   . Frequency of Social Gatherings with Friends and Family:   . Attends Religious Services:   . Active Member of Clubs or Organizations:   . Attends Banker Meetings:   Marland Kitchen Marital Status:   Intimate Partner Violence:   . Fear of Current or Ex-Partner:   . Emotionally Abused:   Marland Kitchen Physically Abused:   . Sexually Abused:     Past Surgical History:  Procedure Laterality Date  . CHEST TUBE INSERTION      No family history on file.  No Known Allergies  No current outpatient medications on file prior to visit.   No current facility-administered medications on file prior to visit.    BP 138/87   Pulse 91   Temp 97.9 F (36.6 C)   Resp 18   Ht 5\' 10"  (1.778 m)   Wt 175 lb 3.2 oz (79.5  kg)   SpO2 96%   BMI 25.14 kg/m       Objective:   Physical Exam  General- No acute distress. Pleasant patient. Neck- Full range of motion, no jvd Lungs- Clear, even and unlabored. Heart- regular rate and rhythm. Neurologic- CNII- XII grossly intact. Skin- normal.  Abdomen- soft, nt, nd, +bs, no rebound or guarding. No organomegaly. Back- no cva tenderness. Left elbow lateral epicondyle pain on palpation.      Assessment & Plan:  For you wellness exam today I have ordered cbc, cmp b12, b1 and lipid panel.future fasting labs.   Recommend exercise and healthy diet.  Cut back on soda significantly.  We will let you know lab results as they come in.  For smoking cessation, I prescribed Wellbutrin.  Rx advisement given.  For alcohol  abuse, recommend cutting back significantly or stopping completely.    For left elbow pain/lateral epicondylitis type presentation refer to sports medicine.  Screening Covid IgG antibodies when you get wellness labs done.  Follow up date appointment will be determined after lab review.   Mackie Pai, PA-C    In addition to the wellness exam for new patient, I did address diagnoses today.  This included smoking cessation, alcohol abuse and epicondylitis.  Extra 15 minutes spent with patient.  99212charge in addition to wellness exam.

## 2020-03-12 ENCOUNTER — Other Ambulatory Visit: Payer: BC Managed Care – PPO

## 2020-03-12 ENCOUNTER — Ambulatory Visit: Payer: BC Managed Care – PPO | Admitting: Family Medicine

## 2020-03-12 NOTE — Progress Notes (Deleted)
  Keith Morse - 29 y.o. male MRN [id number]  Date of birth: July 10, 1991  SUBJECTIVE:  Including CC & ROS.  No chief complaint on file.   Keith Morse is a 29 y.o. male that is  ***.  ***   Review of Systems See HPI   HISTORY: Past Medical, Surgical, Social, and Family History Reviewed & Updated per EMR.   Pertinent Historical Findings include:  Past Medical History:  Diagnosis Date  . Collapsed lung     Past Surgical History:  Procedure Laterality Date  . CHEST TUBE INSERTION      No family history on file.  Social History   Socioeconomic History  . Marital status: Single    Spouse name: Not on file  . Number of children: Not on file  . Years of education: Not on file  . Highest education level: Not on file  Occupational History  . Not on file  Tobacco Use  . Smoking status: Current Every Day Smoker    Types: Cigarettes  . Smokeless tobacco: Never Used  Vaping Use  . Vaping Use: Never used  Substance and Sexual Activity  . Alcohol use: Yes    Comment: weekly  . Drug use: Not Currently  . Sexual activity: Not on file  Other Topics Concern  . Not on file  Social History Narrative  . Not on file   Social Determinants of Health   Financial Resource Strain:   . Difficulty of Paying Living Expenses:   Food Insecurity:   . Worried About Programme researcher, broadcasting/film/video in the Last Year:   . Barista in the Last Year:   Transportation Needs:   . Freight forwarder (Medical):   Marland Kitchen Lack of Transportation (Non-Medical):   Physical Activity:   . Days of Exercise per Week:   . Minutes of Exercise per Session:   Stress:   . Feeling of Stress :   Social Connections:   . Frequency of Communication with Friends and Family:   . Frequency of Social Gatherings with Friends and Family:   . Attends Religious Services:   . Active Member of Clubs or Organizations:   . Attends Banker Meetings:   Marland Kitchen Marital Status:   Intimate Partner Violence:   . Fear of  Current or Ex-Partner:   . Emotionally Abused:   Marland Kitchen Physically Abused:   . Sexually Abused:      PHYSICAL EXAM:  VS: There were no vitals taken for this visit. Physical Exam Gen: NAD, alert, cooperative with exam, well-appearing MSK:  ***      ASSESSMENT & PLAN:   No problem-specific Assessment & Plan notes found for this encounter.

## 2020-03-20 ENCOUNTER — Other Ambulatory Visit: Payer: Self-pay

## 2020-03-20 ENCOUNTER — Other Ambulatory Visit (INDEPENDENT_AMBULATORY_CARE_PROVIDER_SITE_OTHER): Payer: BC Managed Care – PPO

## 2020-03-20 DIAGNOSIS — Z1152 Encounter for screening for COVID-19: Secondary | ICD-10-CM | POA: Diagnosis not present

## 2020-03-20 DIAGNOSIS — Z0000 Encounter for general adult medical examination without abnormal findings: Secondary | ICD-10-CM | POA: Diagnosis not present

## 2020-03-20 DIAGNOSIS — R5383 Other fatigue: Secondary | ICD-10-CM

## 2020-03-21 LAB — CBC WITH DIFFERENTIAL/PLATELET
Basophils Absolute: 0.1 10*3/uL (ref 0.0–0.1)
Basophils Relative: 0.9 % (ref 0.0–3.0)
Eosinophils Absolute: 0.1 10*3/uL (ref 0.0–0.7)
Eosinophils Relative: 1.3 % (ref 0.0–5.0)
HCT: 44.2 % (ref 39.0–52.0)
Hemoglobin: 14.7 g/dL (ref 13.0–17.0)
Lymphocytes Relative: 23.5 % (ref 12.0–46.0)
Lymphs Abs: 2.6 10*3/uL (ref 0.7–4.0)
MCHC: 33.3 g/dL (ref 30.0–36.0)
MCV: 94.8 fl (ref 78.0–100.0)
Monocytes Absolute: 0.9 10*3/uL (ref 0.1–1.0)
Monocytes Relative: 8.6 % (ref 3.0–12.0)
Neutro Abs: 7.2 10*3/uL (ref 1.4–7.7)
Neutrophils Relative %: 65.7 % (ref 43.0–77.0)
Platelets: 212 10*3/uL (ref 150.0–400.0)
RBC: 4.67 Mil/uL (ref 4.22–5.81)
RDW: 13.1 % (ref 11.5–15.5)
WBC: 10.9 10*3/uL — ABNORMAL HIGH (ref 4.0–10.5)

## 2020-03-21 LAB — COMPREHENSIVE METABOLIC PANEL
ALT: 44 U/L (ref 0–53)
AST: 26 U/L (ref 0–37)
Albumin: 4.9 g/dL (ref 3.5–5.2)
Alkaline Phosphatase: 91 U/L (ref 39–117)
BUN: 18 mg/dL (ref 6–23)
CO2: 26 mEq/L (ref 19–32)
Calcium: 9.8 mg/dL (ref 8.4–10.5)
Chloride: 102 mEq/L (ref 96–112)
Creatinine, Ser: 0.81 mg/dL (ref 0.40–1.50)
GFR: 110.98 mL/min (ref >60.00)
Glucose, Bld: 67 mg/dL — ABNORMAL LOW (ref 70–99)
Potassium: 4.2 mEq/L (ref 3.5–5.1)
Sodium: 138 mEq/L (ref 135–145)
Total Bilirubin: 0.6 mg/dL (ref 0.2–1.2)
Total Protein: 7.2 g/dL (ref 6.0–8.3)

## 2020-03-21 LAB — LIPID PANEL
Cholesterol: 168 mg/dL (ref 0–200)
HDL: 49.6 mg/dL (ref >39.00)
LDL Cholesterol: 105 mg/dL — ABNORMAL HIGH (ref 0–99)
NonHDL: 118.39
Total CHOL/HDL Ratio: 3
Triglycerides: 67 mg/dL (ref 0.0–149.0)
VLDL: 13.4 mg/dL (ref 0.0–40.0)

## 2020-03-21 LAB — SARS-COV-2 ANTIBODY(IGG)SPIKE,SEMI-QUANTITATIVE: SARS COV1 AB(IGG)SPIKE,SEMI QN: <1 index (ref <1.00)

## 2020-03-21 LAB — VITAMIN B12: Vitamin B-12: 413 pg/mL (ref 211–911)

## 2020-03-25 ENCOUNTER — Other Ambulatory Visit: Payer: Self-pay

## 2020-03-25 ENCOUNTER — Ambulatory Visit: Payer: BC Managed Care – PPO | Admitting: Medical

## 2020-03-25 VITALS — BP 122/76 | HR 89 | Resp 18 | Ht 70.0 in | Wt 173.6 lb

## 2020-03-25 DIAGNOSIS — H6121 Impacted cerumen, right ear: Secondary | ICD-10-CM

## 2020-03-25 DIAGNOSIS — R000 Tachycardia, unspecified: Secondary | ICD-10-CM | POA: Diagnosis not present

## 2020-03-25 MED ORDER — DOXYCYCLINE HYCLATE 100 MG PO TABS: 100.0000 mg | ORAL_TABLET | Freq: Two times a day (BID) | ORAL | 0 refills | Status: DC

## 2020-03-25 MED ORDER — DOXYCYCLINE HYCLATE 100 MG PO TABS
100.0000 mg | ORAL_TABLET | Freq: Two times a day (BID) | ORAL | 0 refills | Status: DC
Start: 2020-03-25 — End: 2020-03-25

## 2020-03-25 NOTE — Patient Instructions (Addendum)
Failed wax lavage today. Use debrox over the counter for 4 days and follow up for re-lavage.  For hx of tachycardia intermittent avoid all caffeine or stimulants/sudafed. Will refer you to cardiologist for evaluation. Today you had nsr.rate 86.  Follow up next Monday.

## 2020-03-25 NOTE — Progress Notes (Signed)
Subjective:    Patient ID: Keith Morse, male    DOB: 1991-08-03, 29 y.o.   MRN: [id number]  HPI  Pt in for rt ear feeling blocked for one month. Decreased hearing. Using ear plugs at work.  In the past he had some occasional cerumen impaction.  No ever, no chills or sweats.  Pt states his hr will randomly increase to even 130 at rest. Ranges from 70-130. He states hear feels like heart races at times. He drinks 20 mountain dew a day. He stopped moutain dew. Still feels like having fast hr. Sees 100-130 at rest various times. This has been going on for months per pt.     Review of Systems  Constitutional: Negative for chills, fatigue and fever.  HENT: Negative for congestion, ear discharge, postnasal drip, sinus pressure, sinus pain and sneezing.        Wax obstuction.rt ear.  Respiratory: Negative for chest tightness, shortness of breath and wheezing.   Cardiovascular: Negative for chest pain and palpitations.       See hpi.  Gastrointestinal: Negative for abdominal pain.  Musculoskeletal: Negative for back pain.  Skin: Negative for rash.  Hematological: Negative for adenopathy. Does not bruise/bleed easily.  Psychiatric/Behavioral: Negative for behavioral problems and decreased concentration.    Past Medical History:  Diagnosis Date  . Collapsed lung      Social History   Socioeconomic History  . Marital status: Single    Spouse name: Not on file  . Number of children: Not on file  . Years of education: Not on file  . Highest education level: Not on file  Occupational History  . Not on file  Tobacco Use  . Smoking status: Current Every Day Smoker    Types: Cigarettes  . Smokeless tobacco: Never Used  Vaping Use  . Vaping Use: Never used  Substance and Sexual Activity  . Alcohol use: Yes    Comment: weekly  . Drug use: Not Currently  . Sexual activity: Not on file  Other Topics Concern  . Not on file  Social History Narrative  . Not on file   Social  Determinants of Health   Financial Resource Strain:   . Difficulty of Paying Living Expenses:   Food Insecurity:   . Worried About Programme researcher, broadcasting/film/video in the Last Year:   . Barista in the Last Year:   Transportation Needs:   . Freight forwarder (Medical):   Marland Kitchen Lack of Transportation (Non-Medical):   Physical Activity:   . Days of Exercise per Week:   . Minutes of Exercise per Session:   Stress:   . Feeling of Stress :   Social Connections:   . Frequency of Communication with Friends and Family:   . Frequency of Social Gatherings with Friends and Family:   . Attends Religious Services:   . Active Member of Clubs or Organizations:   . Attends Banker Meetings:   Marland Kitchen Marital Status:   Intimate Partner Violence:   . Fear of Current or Ex-Partner:   . Emotionally Abused:   Marland Kitchen Physically Abused:   . Sexually Abused:     Past Surgical History:  Procedure Laterality Date  . CHEST TUBE INSERTION      No family history on file.  No Known Allergies  Current Outpatient Medications on File Prior to Visit  Medication Sig Dispense Refill  . buPROPion (WELLBUTRIN XL) 150 MG 24 hr tablet Take 1 tablet (150 mg  total) by mouth daily. 30 tablet 3   No current facility-administered medications on file prior to visit.    BP 122/76   Pulse 89   Resp 18   Ht 5\' 10"  (1.778 m)   Wt 173 lb 9.6 oz (78.7 kg)   SpO2 98%   BMI 24.91 kg/m       Objective:   Physical Exam  General- No acute distress. Pleasant patient. Neck- Full range of motion, no jvd Lungs- Clear, even and unlabored. Heart- regular rate and rhythm. Neurologic- CNII- XII grossly intact.  heent- normal. Except rt ear cerumen impaction. Moderate large amount.        Assessment & Plan:  Failed wax lavage today. Use debrox over the counter for 4 days and follow up for re-lavage.  For hx of tachycardia intermittent avoid all caffeine or stimulants/sudafed. Will refer you to cardiologist for  evaluation. Today you had nsr.rate 86.  Follow up next Monday.  Friday, PA-C   Time spent with patient today was 35  minutes which consisted of chart review, discussing diagnosis, performing lavage,  explaining treatment, ekg, placing referral  and documentation.

## 2020-03-26 LAB — VITAMIN B1: Vitamin B1 (Thiamine): 8 nmol/L (ref 8–30)

## 2020-03-31 ENCOUNTER — Ambulatory Visit: Payer: BC Managed Care – PPO | Admitting: Medical

## 2020-04-02 ENCOUNTER — Encounter: Payer: Self-pay | Admitting: Medical

## 2020-04-02 ENCOUNTER — Ambulatory Visit: Payer: BC Managed Care – PPO | Admitting: Medical

## 2020-04-02 ENCOUNTER — Other Ambulatory Visit: Payer: Self-pay

## 2020-04-02 VITALS — BP 139/80 | HR 80 | Temp 98.3°F | Resp 18 | Ht 70.0 in | Wt 171.6 lb

## 2020-04-02 DIAGNOSIS — J342 Deviated nasal septum: Secondary | ICD-10-CM

## 2020-04-02 DIAGNOSIS — Z8781 Personal history of (healed) traumatic fracture: Secondary | ICD-10-CM

## 2020-04-02 DIAGNOSIS — H6122 Impacted cerumen, left ear: Secondary | ICD-10-CM

## 2020-04-02 MED ORDER — AZITHROMYCIN 250 MG PO TABS
ORAL_TABLET | ORAL | 0 refills | Status: DC
Start: 2020-04-02 — End: 2020-04-02

## 2020-04-02 NOTE — Patient Instructions (Addendum)
We were successful getting out about 70% of wax. Recommend waiting and not doing any aggressive measures to remove wax since symptoms improve. If feels blocked again then use debrox but remember 3-5 days use before attempted lavage more successful.  Will refer to ENT for hx of nose fracture and appears to have deviated septum.  See cardiologist for hx of tachycardia. Avoid caffeine. If you have more frequent episodes let me know and can see if appointment can be moved up.  Follow up as needed

## 2020-04-02 NOTE — Progress Notes (Signed)
Subjective:    Patient ID: Keith Morse, male    DOB: December 06, 1990, 29 y.o.   MRN: [id number]  HPI   Pt states he just got debrox today. He used 7 drops. I had advised best success to use for 3-4 days then attempt lavage. Last lavage attempt last week unsuccesful.  Hx of nose fx in 2010. Since then wife thinks she can hear ruff breathing and he sound like talking thru his nose.   Pt has appointment with cardiologist in September. No recent tachycardia events described. He is still drinking occasional mountain dew      Review of Systems  Constitutional: Negative for chills, fatigue and fever.  HENT: Negative for congestion, ear pain, nosebleeds and postnasal drip.        Rt ear wax. No symptoms.  Respiratory: Negative for cough, chest tightness, shortness of breath and wheezing.   Cardiovascular: Negative for chest pain and palpitations.  Skin: Negative for rash.  Hematological: Negative for adenopathy. Does not bruise/bleed easily.  Psychiatric/Behavioral: Negative for behavioral problems, decreased concentration and suicidal ideas. The patient is not nervous/anxious.     Past Medical History:  Diagnosis Date  . Collapsed lung      Social History   Socioeconomic History  . Marital status: Single    Spouse name: Not on file  . Number of children: Not on file  . Years of education: Not on file  . Highest education level: Not on file  Occupational History  . Not on file  Tobacco Use  . Smoking status: Current Every Day Smoker    Types: Cigarettes  . Smokeless tobacco: Never Used  Vaping Use  . Vaping Use: Never used  Substance and Sexual Activity  . Alcohol use: Yes    Comment: weekly  . Drug use: Not Currently  . Sexual activity: Not on file  Other Topics Concern  . Not on file  Social History Narrative  . Not on file   Social Determinants of Health   Financial Resource Strain:   . Difficulty of Paying Living Expenses:   Food Insecurity:   . Worried  About Programme researcher, broadcasting/film/video in the Last Year:   . Barista in the Last Year:   Transportation Needs:   . Freight forwarder (Medical):   Marland Kitchen Lack of Transportation (Non-Medical):   Physical Activity:   . Days of Exercise per Week:   . Minutes of Exercise per Session:   Stress:   . Feeling of Stress :   Social Connections:   . Frequency of Communication with Friends and Family:   . Frequency of Social Gatherings with Friends and Family:   . Attends Religious Services:   . Active Member of Clubs or Organizations:   . Attends Banker Meetings:   Marland Kitchen Marital Status:   Intimate Partner Violence:   . Fear of Current or Ex-Partner:   . Emotionally Abused:   Marland Kitchen Physically Abused:   . Sexually Abused:     Past Surgical History:  Procedure Laterality Date  . CHEST TUBE INSERTION      No family history on file.  No Known Allergies  Current Outpatient Medications on File Prior to Visit  Medication Sig Dispense Refill  . buPROPion (WELLBUTRIN XL) 150 MG 24 hr tablet Take 1 tablet (150 mg total) by mouth daily. 30 tablet 3   No current facility-administered medications on file prior to visit.    BP (!) 139/80   Pulse  80   Temp 98.3 F (36.8 C) (Oral)   Resp 18   Ht 5\' 10"  (1.778 m)   Wt 171 lb 9.6 oz (77.8 kg)   SpO2 98%   BMI 24.62 kg/m       Objective:   Physical Exam  General- No acute distress. Pleasant patient. Neck- Full range of motion, no jvd Lungs- Clear, even and unlabored. Heart- regular rate and rhythm. Neurologic- CNII- XII grossly intact. heent- left ear canal clear. Rt ear canal wax blocking view of tm. Post lavage abou 70% of wax removed. Small amount deep in canal blocking good view of tm.  Nose- appears deviated to rt and septum appears deviated as well.      Assessment & Plan:  We were successful getting out about 70% of wax. Recommend waiting and not doing any aggressive measures to remove wax. If feels blocked again then use  debrox but remember 3-5 days use before attempted lavage more successful.  Will refer to ENT for hx of nose fracture and appears to have deviated septum.  See cardiologist for hx of tachycardia. Avoid caffeine. If you have more frequent episodes let me know and can see if appointment can be moved up.  Follow up as needed  , PA-C   Time spent with patient today was 25  minutes which consisted of chart review, discussing diagnosis, work up, treatment, referral to ent and documentation.

## 2020-04-23 ENCOUNTER — Ambulatory Visit: Payer: BC Managed Care – PPO | Admitting: Cardiology

## 2020-05-05 ENCOUNTER — Other Ambulatory Visit: Payer: Self-pay

## 2020-05-05 ENCOUNTER — Encounter (HOSPITAL_BASED_OUTPATIENT_CLINIC_OR_DEPARTMENT_OTHER): Payer: Self-pay | Admitting: *Deleted

## 2020-05-05 ENCOUNTER — Emergency Department (HOSPITAL_BASED_OUTPATIENT_CLINIC_OR_DEPARTMENT_OTHER): Payer: BC Managed Care – PPO

## 2020-05-05 ENCOUNTER — Emergency Department (HOSPITAL_BASED_OUTPATIENT_CLINIC_OR_DEPARTMENT_OTHER)
Admission: EM | Admit: 2020-05-05 | Discharge: 2020-05-06 | Disposition: A | Discharge status: Home / Self Care | Payer: BC Managed Care – PPO | Attending: Emergency Medicine | Admitting: Emergency Medicine

## 2020-05-05 DIAGNOSIS — R072 Precordial pain: Secondary | ICD-10-CM | POA: Diagnosis not present

## 2020-05-05 DIAGNOSIS — R0602 Shortness of breath: Secondary | ICD-10-CM | POA: Insufficient documentation

## 2020-05-05 DIAGNOSIS — D72829 Elevated white blood cell count, unspecified: Secondary | ICD-10-CM | POA: Insufficient documentation

## 2020-05-05 DIAGNOSIS — R05 Cough: Secondary | ICD-10-CM | POA: Insufficient documentation

## 2020-05-05 DIAGNOSIS — R079 Chest pain, unspecified: Secondary | ICD-10-CM | POA: Diagnosis not present

## 2020-05-05 DIAGNOSIS — F17210 Nicotine dependence, cigarettes, uncomplicated: Secondary | ICD-10-CM | POA: Insufficient documentation

## 2020-05-05 DIAGNOSIS — R0789 Other chest pain: Secondary | ICD-10-CM | POA: Diagnosis not present

## 2020-05-05 LAB — CBC WITH DIFFERENTIAL/PLATELET
Abs Immature Granulocytes: 0.05 10*3/uL (ref 0.00–0.07)
Basophils Absolute: 0.1 10*3/uL (ref 0.0–0.1)
Basophils Relative: 1 %
Eosinophils Absolute: 0.3 10*3/uL (ref 0.0–0.5)
Eosinophils Relative: 3 %
HCT: 43.2 % (ref 39.0–52.0)
Hemoglobin: 14.5 g/dL (ref 13.0–17.0)
Immature Granulocytes: 1 %
Lymphocytes Relative: 30 %
Lymphs Abs: 3.2 10*3/uL (ref 0.7–4.0)
MCH: 31.7 pg (ref 26.0–34.0)
MCHC: 33.6 g/dL (ref 30.0–36.0)
MCV: 94.3 fL (ref 80.0–100.0)
Monocytes Absolute: 1 10*3/uL (ref 0.1–1.0)
Monocytes Relative: 9 %
Neutro Abs: 6.1 10*3/uL (ref 1.7–7.7)
Neutrophils Relative %: 56 %
Platelets: 274 10*3/uL (ref 150–400)
RBC: 4.58 MIL/uL (ref 4.22–5.81)
RDW: 12.7 % (ref 11.5–15.5)
WBC: 10.8 10*3/uL — ABNORMAL HIGH (ref 4.0–10.5)
nRBC: 0 % (ref 0.0–0.2)

## 2020-05-05 LAB — BASIC METABOLIC PANEL
Anion gap: 8 (ref 5–15)
BUN: 20 mg/dL (ref 6–20)
CO2: 24 mmol/L (ref 22–32)
Calcium: 9.1 mg/dL (ref 8.9–10.3)
Chloride: 107 mmol/L (ref 98–111)
Creatinine, Ser: 0.82 mg/dL (ref 0.61–1.24)
GFR calc Af Amer: >60 mL/min (ref >60)
GFR calc non Af Amer: >60 mL/min (ref >60)
Glucose, Bld: 90 mg/dL (ref 70–99)
Potassium: 4.2 mmol/L (ref 3.5–5.1)
Sodium: 139 mmol/L (ref 135–145)

## 2020-05-05 LAB — TROPONIN I (HIGH SENSITIVITY): Troponin I (High Sensitivity): <2 ng/L (ref <18)

## 2020-05-05 LAB — D-DIMER, QUANTITATIVE: D-Dimer, Quant: 0.32 ug/mL-FEU (ref 0.00–0.50)

## 2020-05-05 MED ORDER — NAPROXEN 500 MG PO TABS: 500.0000 mg | ORAL_TABLET | Freq: Two times a day (BID) | ORAL | 0 refills | Status: DC

## 2020-05-05 NOTE — ED Provider Notes (Signed)
[facility] EMERGENCY DEPARTMENT Provider Note   CSN: [id number] Arrival date & time: 05/05/20  1659    History Chief Complaint  Patient presents with  . Chest Pain   Keith Morse is a 29 y.o. male with past medical history significant for spontaneous pneumothorax presents for evaluation of chest pain.  Patient with dull achy chest pain over the last 2 days.  Non exertional nature.  Nonpleuritic.  Worse when he moves.  No recent injury or trauma.  Has had some intermittent shortness of breath.  States he has a cough however this is chronic as he is a tobacco user.  No unilateral leg swelling, redness or warmth.  No hemoptysis.  Chest pain does not radiate to back, left arm or jaw.  No associated diaphoresis, nausea, vomiting.  Rates pain a 5/10.  States this happens several days a week and then self resolves.  Denies additional aggravating or alleviating factors.  No history of hypertension, hyperlipidemia, diabetes, family history of MI at early age.  No prior history of clotting disorders, recent surgery, immobilization, malignancy.  Did not received COVID vaccine. Unknown exposures  Denies additional aggravating or alleviating factors.  HPI     Past Medical History:  Diagnosis Date  . Collapsed lung     There are no problems to display for this patient.   Past Surgical History:  Procedure Laterality Date  . CHEST TUBE INSERTION         No family history on file.  Social History   Tobacco Use  . Smoking status: Current Every Day Smoker    Types: Cigarettes  . Smokeless tobacco: Never Used  Vaping Use  . Vaping Use: Never used  Substance Use Topics  . Alcohol use: Yes    Comment: weekly  . Drug use: Not Currently    Home Medications Prior to Admission medications   Medication Sig Start Date End Date Taking? Authorizing Provider  buPROPion (WELLBUTRIN XL) 150 MG 24 hr tablet Take 1 tablet (150 mg total) by mouth daily. 02/27/20   Saguier, Ramon Dredge, PA-C     Allergies    Patient has no known allergies.  Review of Systems   Review of Systems  Constitutional: Negative.   HENT: Negative.   Respiratory: Positive for cough and shortness of breath. Negative for apnea, choking, chest tightness, wheezing and stridor.   Cardiovascular: Positive for chest pain. Negative for palpitations and leg swelling.  Gastrointestinal: Negative.   Genitourinary: Negative.   Musculoskeletal: Negative.   Skin: Negative.   Neurological: Negative.   All other systems reviewed and are negative.   Physical Exam Updated Vital Signs BP 118/82   Pulse 61   Temp 98.5 F (36.9 C) (Oral)   Resp 19   SpO2 100%   Physical Exam Vitals and nursing note reviewed.  Constitutional:      General: He is not in acute distress.    Appearance: He is not ill-appearing, toxic-appearing or diaphoretic.  HENT:     Head: Normocephalic and atraumatic.     Jaw: There is normal jaw occlusion.     Right Ear: Tympanic membrane, ear canal and external ear normal. There is no impacted cerumen. No hemotympanum. Tympanic membrane is not injected, scarred, perforated, erythematous, retracted or bulging.     Left Ear: Tympanic membrane, ear canal and external ear normal. There is no impacted cerumen. No hemotympanum. Tympanic membrane is not injected, scarred, perforated, erythematous, retracted or bulging.     Ears:  Comments: No Mastoid tenderness.    Nose:     Comments: Clear rhinorrhea and congestion to bilateral nares.  No sinus tenderness.    Mouth/Throat:     Comments: Posterior oropharynx clear.  Mucous membranes moist.  Tonsils without erythema or exudate.  Uvula midline without deviation.  No evidence of PTA or RPA.  No drooling, dysphasia or trismus.  Phonation normal. Neck:     Trachea: Trachea and phonation normal.     Meningeal: Brudzinski's sign and Kernig's sign absent.     Comments: No Neck stiffness or neck rigidity.  No meningismus.  No cervical  lymphadenopathy. Cardiovascular:     Comments: No murmurs rubs or gallops. Pulmonary:     Comments: Clear to auscultation bilaterally without wheeze, rhonchi or rales.  No accessory muscle usage.  Able speak in full sentences. Abdominal:     Comments: Soft, nontender without rebound or guarding.  No CVA tenderness.  Musculoskeletal:     Comments: Moves all 4 extremities without difficulty.  Lower extremities without edema, erythema or warmth.  Skin:    Comments: Brisk capillary refill.  No rashes or lesions.  Neurological:     Mental Status: He is alert.     Comments: Ambulatory in department without difficulty.  Cranial nerves II through XII grossly intact.  No facial droop.  No aphasia.    ED Results / Procedures / Treatments   Labs (all labs ordered are listed, but only abnormal results are displayed) Labs Reviewed  CBC WITH DIFFERENTIAL/PLATELET - Abnormal; Notable for the following components:      Result Value   WBC 10.8 (*)    All other components within normal limits  BASIC METABOLIC PANEL  D-DIMER, QUANTITATIVE (NOT AT [facility])  TROPONIN I (HIGH SENSITIVITY)  TROPONIN I (HIGH SENSITIVITY)    EKG EKG Interpretation  Date/Time:  Monday May 05 2020 23:06:11 EDT Ventricular Rate:  63 PR Interval:    QRS Duration: 93 QT Interval:  390 QTC Calculation: 400 R Axis:   12 Text Interpretation: Sinus rhythm Abnormal inferior Q waves ST elevation, consider lateral injury When compared to prior, new t wave inversion in lead 3. otherwise similar appearance. No STEMI Confirmed by Theda Belfast ([id number]) on 05/05/2020 11:17:24 PM   Radiology DG Chest 2 View  Result Date: 05/05/2020 CLINICAL DATA:  Chest pain EXAM: CHEST - 2 VIEW COMPARISON:  10/03/2008 FINDINGS: Right apical partial lung resection again noted. Lungs are otherwise clear. No pneumothorax or pleural effusion. Cardiac size within normal limits. Pulmonary vascularity is normal. No acute bone abnormality. IMPRESSION:  No active cardiopulmonary disease. Electronically Signed   By: Helyn Numbers MD   On: 05/05/2020 18:12   Procedures Procedures (including critical care time)  Medications Ordered in ED Medications - No data to display  ED Course  I have reviewed the triage vital signs and the nursing notes.  Pertinent labs & imaging results that were available during my care of the patient were reviewed by me and considered in my medical decision making (see chart for details).  29 year old presents for evaluation of chest pain and shortness of breath.  He is afebrile, nonseptic, non-ill-appearing.  His pain shortness of breath x2 days.  Nonexertional nature.  He does have reproducible pain to his chest wall.  No unilateral leg swelling, redness or warmth.  Does have history of recurrent chest pain.  No recent surgery immobilization.  No evidence of DVT on exam.  Plan on labs, imaging and reassess.  Labs and  imaging personally reviewed and interpreted: CBC with leukocytosis at 10.8 Metabolic panel without electrolyte, renal or liver normality Troponin <2 DG chest without acute infiltrates, cardiomegaly, pleural edema, pneumothorax EKG with possible t wave inversion, per attending, Dr. Rush Landmark.  Has some ST elevation however has had this in prior EKGs.  I patient with Dr. Rush Landmark has recommended D-dimer.  I discussed additional labs with patient.  He is agreeable to this.  Dimer within normal limits.  Discussed with attending, Dr. Rush Landmark.  Patient has appointment with cardiology in 1 week.  I discussed importance of follow-up with him given he has recurrent pain and extended period of time.  Reassuring labs here today.  Low suspicion for ACS, PE, dissection, Covid or bacterial infectious process.  Chest x-ray without evidence of pneumothorax.  Patient is to be discharged with recommendation to follow up with PCP in regards to today's hospital visit. Chest pain is not likely of cardiac or pulmonary  etiology d/t presentation, PERC negative, VSS, no tracheal deviation, no JVD or new murmur, RRR, breath sounds equal bilaterally, EKG without acute abnormalities, negative troponin, and negative CXR. Pt has been advised to return to the ED if CP becomes exertional, associated with diaphoresis or nausea, radiates to left jaw/arm, worsens or becomes concerning in any way. Pt appears reliable for follow up and is agreeable to discharge.   The patient has been appropriately medically screened and/or stabilized in the ED. I have low suspicion for any other emergent medical condition which would require further screening, evaluation or treatment in the ED or require inpatient management.  Patient is hemodynamically stable and in no acute distress.  Patient able to ambulate in department prior to ED.  Evaluation does not show acute pathology that would require ongoing or additional emergent interventions while in the emergency department or further inpatient treatment.  I have discussed the diagnosis with the patient and answered all questions.  Pain is been managed while in the emergency department and patient has no further complaints prior to discharge.  Patient is comfortable with plan discussed in room and is stable for discharge at this time.  I have discussed strict return precautions for returning to the emergency department.  Patient was encouraged to follow-up with PCP/specialist refer to at discharge.    MDM Rules/Calculators/A&P                          Square Jowett was evaluated in Emergency Department on 05/05/2020 for the symptoms described in the history of present illness. He was evaluated in the context of the global COVID-19 pandemic, which necessitated consideration that the patient might be at risk for infection with the SARS-CoV-2 virus that causes COVID-19. Institutional protocols and algorithms that pertain to the evaluation of patients at risk for COVID-19 are in a state of rapid change  based on information released by regulatory bodies including the CDC and federal and state organizations. These policies and algorithms were followed during the patient's care in the ED.  Final Clinical Impression(s) / ED Diagnoses Final diagnoses:  Precordial pain    Rx / DC Orders ED Discharge Orders    None       Devon Pretty A, PA-C 05/05/20 2346    Tegeler, Canary Brim, MD 05/05/20 2358

## 2020-05-05 NOTE — ED Notes (Signed)
Lab informed of d-dimer tube in lab.

## 2020-05-05 NOTE — Discharge Instructions (Signed)
Follow up with Cardiology.  Return for new or worsening symptoms 

## 2020-05-05 NOTE — ED Triage Notes (Signed)
C/o chest pain and SOB x 2 days. HX of spont  pneumothorax

## 2020-05-16 ENCOUNTER — Ambulatory Visit: Payer: BC Managed Care – PPO | Admitting: Cardiology

## 2020-06-18 ENCOUNTER — Ambulatory Visit: Payer: BC Managed Care – PPO | Admitting: Cardiology

## 2020-06-18 ENCOUNTER — Other Ambulatory Visit: Payer: Self-pay

## 2020-06-18 ENCOUNTER — Ambulatory Visit (INDEPENDENT_AMBULATORY_CARE_PROVIDER_SITE_OTHER): Payer: BC Managed Care – PPO

## 2020-06-18 ENCOUNTER — Encounter: Payer: Self-pay | Admitting: Cardiology

## 2020-06-18 VITALS — BP 134/96 | HR 92 | Ht 70.0 in | Wt 172.0 lb

## 2020-06-18 DIAGNOSIS — Z720 Tobacco use: Secondary | ICD-10-CM

## 2020-06-18 DIAGNOSIS — F1029 Alcohol dependence with unspecified alcohol-induced disorder: Secondary | ICD-10-CM

## 2020-06-18 DIAGNOSIS — R079 Chest pain, unspecified: Secondary | ICD-10-CM | POA: Diagnosis not present

## 2020-06-18 DIAGNOSIS — R002 Palpitations: Secondary | ICD-10-CM | POA: Diagnosis not present

## 2020-06-18 DIAGNOSIS — R0602 Shortness of breath: Secondary | ICD-10-CM

## 2020-06-18 MED ORDER — METOPROLOL TARTRATE 100 MG PO TABS: 100.0000 mg | ORAL_TABLET | Freq: Once | ORAL | 0 refills | Status: DC

## 2020-06-18 NOTE — Progress Notes (Signed)
Cardiology Office Note:    Date:  06/18/2020   ID:  Keith Morse, DOB 08-24-1991, MRN [id number]  PCP:  Mackie Pai, PA-C  Cardiologist:  No primary care provider on file.  Electrophysiologist:  None   Referring MD: Mackie Pai, PA-C   Chief Complaint  Patient presents with  . Tachycardia  . Chest Pain    History of Present Illness:    Keith Morse is a 29 y.o. male with a hx of spontaneous pneumothorax, chronic alcohol use, smoker sent today to be evaluated for chest pain and palpitations.  The patient tells me that he has had 2 different set of chest pain he has his normal usual burning chest pain which he knows is from his reflux but recently he has been experiencing this left-sided chest heaviness which he reports as a radiating pain sometimes up his shoulders and down his arm.  He states that when this pain happens he feels like someone is pushing on his chest and he is really concerned.  He does report associated shortness of breath.  He tells me he also has separate symptoms of significant palpitations.  He notes that is off and on but days that he drinks heavily which at times will be 2 bottles of wine and lots of beer he feels significant palpitation for the last 4 hours prior to resolution he says that at those times he feels his apple watch his heart rate goes up into the 150s.  He has not passed out he has not had any lightheadedness with this but sometimes he does have shortness of breath.  He has been to the emergency department and recently he was seen in the ED in August at which time he was evaluated with negative troponin and he was sent home.  Past Medical History:  Diagnosis Date  . Collapsed lung     Past Surgical History:  Procedure Laterality Date  . CHEST TUBE INSERTION      Current Medications: Current Meds  Medication Sig  . naproxen (NAPROSYN) 500 MG tablet Take 1 tablet (500 mg total) by mouth 2 (two) times daily.     Allergies:   Patient  has no known allergies.   Social History   Socioeconomic History  . Marital status: Single    Spouse name: Not on file  . Number of children: Not on file  . Years of education: Not on file  . Highest education level: Not on file  Occupational History  . Not on file  Tobacco Use  . Smoking status: Current Every Day Smoker    Types: Cigarettes  . Smokeless tobacco: Never Used  Vaping Use  . Vaping Use: Never used  Substance and Sexual Activity  . Alcohol use: Yes    Comment: weekly  . Drug use: Not Currently  . Sexual activity: Not on file  Other Topics Concern  . Not on file  Social History Narrative  . Not on file   Social Determinants of Health   Financial Resource Strain:   . Difficulty of Paying Living Expenses: Not on file  Food Insecurity:   . Worried About Charity fundraiser in the Last Year: Not on file  . Ran Out of Food in the Last Year: Not on file  Transportation Needs:   . Lack of Transportation (Medical): Not on file  . Lack of Transportation (Non-Medical): Not on file  Physical Activity:   . Days of Exercise per Week: Not on file  . Minutes  of Exercise per Session: Not on file  Stress:   . Feeling of Stress : Not on file  Social Connections:   . Frequency of Communication with Friends and Family: Not on file  . Frequency of Social Gatherings with Friends and Family: Not on file  . Attends Religious Services: Not on file  . Active Member of Clubs or Organizations: Not on file  . Attends Archivist Meetings: Not on file  . Marital Status: Not on file     Family History: The patient's family history is not on file.  ROS:   Review of Systems  Constitution: Negative for decreased appetite, fever and weight gain.  HENT: Negative for congestion, ear discharge, hoarse voice and sore throat.   Eyes: Negative for discharge, redness, vision loss in right eye and visual halos.  Cardiovascular: Reports chest pain, shortness of breath and  palpitations.  Negative for chest pain, leg swelling, orthopnea.  Respiratory: Negative for cough, hemoptysis, shortness of breath and snoring.   Endocrine: Negative for heat intolerance and polyphagia.  Hematologic/Lymphatic: Negative for bleeding problem. Does not bruise/bleed easily.  Skin: Negative for flushing, nail changes, rash and suspicious lesions.  Musculoskeletal: Negative for arthritis, joint pain, muscle cramps, myalgias, neck pain and stiffness.  Gastrointestinal: Negative for abdominal pain, bowel incontinence, diarrhea and excessive appetite.  Genitourinary: Negative for decreased libido, genital sores and incomplete emptying.  Neurological: Negative for brief paralysis, focal weakness, headaches and loss of balance.  Psychiatric/Behavioral: Negative for altered mental status, depression and suicidal ideas.  Allergic/Immunologic: Negative for HIV exposure and persistent infections.    EKGs/Labs/Other Studies Reviewed:    The following studies were reviewed today:   EKG:  The ekg ordered today demonstrates sinus rhythm, heart rate 92 bpm with arrhythmia.  Recent Labs: 03/20/2020: ALT 44 05/05/2020: BUN 20; Creatinine, Ser 0.82; Hemoglobin 14.5; Platelets 274; Potassium 4.2; Sodium 139  Recent Lipid Panel    Component Value Date/Time   CHOL 168 03/20/2020 1519   TRIG 67.0 03/20/2020 1519   HDL 49.60 03/20/2020 1519   CHOLHDL 3 03/20/2020 1519   VLDL 13.4 03/20/2020 1519   LDLCALC 105 (H) 03/20/2020 1519    Physical Exam:    VS:  BP (!) 134/96 (BP Location: Left Arm, Patient Position: Sitting, Cuff Size: Normal)   Pulse 92   Ht 5' 10"  (1.778 m)   Wt 172 lb (78 kg)   SpO2 97%   BMI 24.68 kg/m     Wt Readings from Last 3 Encounters:  06/18/20 172 lb (78 kg)  04/02/20 171 lb 9.6 oz (77.8 kg)  03/25/20 173 lb 9.6 oz (78.7 kg)     GEN: Well nourished, well developed in no acute distress HEENT: Normal NECK: No JVD; No carotid bruits LYMPHATICS: No  lymphadenopathy CARDIAC: S1S2 noted,RRR, no murmurs, rubs, gallops RESPIRATORY:  Clear to auscultation without rales, wheezing or rhonchi  ABDOMEN: Soft, non-tender, non-distended, +bowel sounds, no guarding. EXTREMITIES: No edema, No cyanosis, no clubbing MUSCULOSKELETAL:  No deformity  SKIN: Warm and dry NEUROLOGIC:  Alert and oriented x 3, non-focal PSYCHIATRIC:  Normal affect, good insight  ASSESSMENT:    1. Palpitations   2. Shortness of breath   3. Chest pain of uncertain etiology   4. Tobacco use   5. Alcohol dependence with unspecified alcohol-induced disorder ([place])    PLAN:     I would like to rule out a cardiovascular etiology of this palpitation, especially in the setting of his heavy alcohol use holiday  heart with atrial fibrillation could be a factor here therefore at this time I would like to placed a zio patch for 14 days.   In additon a transthoracic echocardiogram will be ordered to assess LV/RV function and any structural abnormalities. Once these testing have been performed amd reviewed further reccomendations will be made. For now, I do reccomend that the patient goes to the nearest ED if  symptoms recur.  In terms of his chest pain-he does have that he also have GERD/PUD but his risk factors and his recent characteristics change with a left-sided pressure warrants to at least pursue a coronary CTA in this patient.  He has no IV contrast dye allergy.  He is agreeable to proceed with this test.  The patient was counseled on tobacco cessation today for 5 minutes.  Counseling included reviewing the risks of smoking tobacco products, how it impacts the patient's current medical diagnoses and different strategies for quitting.  Pharmacotherapy to aid in tobacco cessation was not prescribed today. The patient coordinate with  primary care provider.  The patient was also advised to call  1-800-QUIT-NOW 214-008-1741) for additional help with quitting smoking.  I have  advised the patient on cessation of significant alcohol use.  He would likely need alcohol rehab to help him safely with stopping the use of alcohol.  The patient is in agreement with the above plan. The patient left the office in stable condition.  The patient will follow up in   Medication Adjustments/Labs and Tests Ordered: Current medicines are reviewed at length with the patient today.  Concerns regarding medicines are outlined above.  Orders Placed This Encounter  Procedures  . CT CORONARY MORPH W/CTA COR W/SCORE W/CA W/CM &/OR WO/CM  . CT CORONARY FRACTIONAL FLOW RESERVE DATA PREP  . CT CORONARY FRACTIONAL FLOW RESERVE FLUID ANALYSIS  . LONG TERM MONITOR (3-14 DAYS)  . EKG 12-Lead  . ECHOCARDIOGRAM COMPLETE   Meds ordered this encounter  Medications  . metoprolol tartrate (LOPRESSOR) 100 MG tablet    Sig: Take 1 tablet (100 mg total) by mouth once for 1 dose. 2 hour before ct    Dispense:  1 tablet    Refill:  0    Patient Instructions  Medication Instructions:  Your physician recommends that you continue on your current medications as directed. Please refer to the Current Medication list given to you today.  *If you need a refill on your cardiac medications before your next appointment, please call your pharmacy*   Lab Work: None.  If you have labs (blood work) drawn today and your tests are completely normal, you will receive your results only by: Marland Kitchen MyChart Message (if you have MyChart) OR . A paper copy in the mail If you have any lab test that is abnormal or we need to change your treatment, we will call you to review the results.   Testing/Procedures: Your physician has requested that you have an echocardiogram. Echocardiography is a painless test that uses sound waves to create images of your heart. It provides your doctor with information about the size and shape of your heart and how well your heart's chambers and valves are working. This procedure takes  approximately one hour. There are no restrictions for this procedure.  A zio monitor was ordered today. It will remain on for 14 days. You will then return monitor and event diary in provided box. It takes 1-2 weeks for report to be downloaded and returned to [place]. We will call  you with the results. If monitor falls off or has orange flashing light, please call Zio for further instructions.     Your cardiac CT will be scheduled at one of the below locations:   [facility] [address] [place], [place] [address] [phone]  [place] [address] [place], [place] [address] [phone]  If scheduled at [facility], please arrive at the [facility] main entrance of [facility] 30 minutes prior to test start time. Proceed to the [facility] Radiology Department (first floor) to check-in and test prep.  If scheduled at [facility], please arrive 15 mins early for check-in and test prep.  Please follow these instructions carefully (unless otherwise directed):  Hold all erectile dysfunction medications at least 3 days (72 hrs) prior to test.  On the Night Before the Test: . Be sure to Drink plenty of water. . Do not consume any caffeinated/decaffeinated beverages or chocolate 12 hours prior to your test. . Do not take any antihistamines 12 hours prior to your test.   On the Day of the Test: . Drink plenty of water. Do not drink any water within one hour of the test. . Do not eat any food 4 hours prior to the test. . You may take your regular medications prior to the test.  . Take metoprolol (Lopressor) two hours prior to test.           After the Test: . Drink plenty of water. . After receiving IV contrast, you may experience a mild flushed feeling. This is normal. . On occasion, you may experience a mild rash up to 24 hours after the test. This is not dangerous. If  this occurs, you can take Benadryl 25 mg and increase your fluid intake. . If you experience trouble breathing, this can be serious. If it is severe call 911 IMMEDIATELY. If it is mild, please call our office. . If you take any of these medications: Glipizide/Metformin, Avandament, Glucavance, please do not take 48 hours after completing test unless otherwise instructed.   Once we have confirmed authorization from your insurance company, we will call you to set up a date and time for your test. Based on how quickly your insurance processes prior authorizations requests, please allow up to 4 weeks to be contacted for scheduling your Cardiac CT appointment. Be advised that routine Cardiac CT appointments could be scheduled as many as 8 weeks after your provider has ordered it.  For non-scheduling related questions, please contact the cardiac imaging nurse navigator should you have any questions/concerns: Marchia Bond, Cardiac Imaging Nurse Navigator Burley Saver, Interim Cardiac Imaging Nurse [facility] Direct Office Dial: [phone]   For scheduling needs, including cancellations and rescheduling, please call Vivien Rota at [phone], option [phone].       Follow-Up: At [facility], you and your health needs are our priority.  As part of our continuing mission to provide you with exceptional heart care, we have created designated Provider Care Teams.  These Care Teams include your primary Cardiologist (physician) and Advanced Practice Providers (APPs -  Physician Assistants and Nurse Practitioners) who all work together to provide you with the care you need, when you need it.  We recommend signing up for the patient portal called "MyChart".  Sign up information is provided on this After Visit Summary.  MyChart is used to connect with patients for Virtual Visits (  Telemedicine).  Patients are able to view lab/test results, encounter notes, upcoming appointments, etc.   Non-urgent messages can be sent to your provider as well.   To learn more about what you can do with MyChart, go to [email or web address].    Your next appointment:   3 month(s)  The format for your next appointment:   In Person  Provider:   Berniece Salines, DO   Other Instructions   Echocardiogram An echocardiogram is a procedure that uses painless sound waves (ultrasound) to produce an image of the heart. Images from an echocardiogram can provide important information about:  Signs of coronary artery disease (CAD).  Aneurysm detection. An aneurysm is a weak or damaged part of an artery wall that bulges out from the normal force of blood pumping through the body.  Heart size and shape. Changes in the size or shape of the heart can be associated with certain conditions, including heart failure, aneurysm, and CAD.  Heart muscle function.  Heart valve function.  Signs of a past heart attack.  Fluid buildup around the heart.  Thickening of the heart muscle.  A tumor or infectious growth around the heart valves. Tell a health care provider about:  Any allergies you have.  All medicines you are taking, including vitamins, herbs, eye drops, creams, and over-the-counter medicines.  Any blood disorders you have.  Any surgeries you have had.  Any medical conditions you have.  Whether you are pregnant or may be pregnant. What are the risks? Generally, this is a safe procedure. However, problems may occur, including:  Allergic reaction to dye (contrast) that may be used during the procedure. What happens before the procedure? No specific preparation is needed. You may eat and drink normally. What happens during the procedure?   An IV tube may be inserted into one of your veins.  You may receive contrast through this tube. A contrast is an injection that improves the quality of the pictures from your heart.  A gel will be applied to your chest.  A wand-like tool  (transducer) will be moved over your chest. The gel will help to transmit the sound waves from the transducer.  The sound waves will harmlessly bounce off of your heart to allow the heart images to be captured in real-time motion. The images will be recorded on a computer. The procedure may vary among health care providers and hospitals. What happens after the procedure?  You may return to your normal, everyday life, including diet, activities, and medicines, unless your health care provider tells you not to do that. Summary  An echocardiogram is a procedure that uses painless sound waves (ultrasound) to produce an image of the heart.  Images from an echocardiogram can provide important information about the size and shape of your heart, heart muscle function, heart valve function, and fluid buildup around your heart.  You do not need to do anything to prepare before this procedure. You may eat and drink normally.  After the echocardiogram is completed, you may return to your normal, everyday life, unless your health care provider tells you not to do that. This information is not intended to replace advice given to you by your health care provider. Make sure you discuss any questions you have with your health care provider. Document Revised: 12/14/2018 Document Reviewed: 09/25/2016 Elsevier Patient Education  [employer].   Cardiac CT Angiogram A cardiac CT angiogram is a procedure to look at the heart and the area around the heart.  It may be done to help find the cause of chest pains or other symptoms of heart disease. During this procedure, a substance called contrast dye is injected into the blood vessels in the area to be checked. A large X-ray machine, called a CT scanner, then takes detailed pictures of the heart and the surrounding area. The procedure is also sometimes called a coronary CT angiogram, coronary artery scanning, or CTA. A cardiac CT angiogram allows the health care  provider to see how well blood is flowing to and from the heart. The health care provider will be able to see if there are any problems, such as:  Blockage or narrowing of the coronary arteries in the heart.  Fluid around the heart.  Signs of weakness or disease in the muscles, valves, and tissues of the heart. Tell a health care provider about:  Any allergies you have. This is especially important if you have had a previous allergic reaction to contrast dye.  All medicines you are taking, including vitamins, herbs, eye drops, creams, and over-the-counter medicines.  Any blood disorders you have.  Any surgeries you have had.  Any medical conditions you have.  Whether you are pregnant or may be pregnant.  Any anxiety disorders, chronic pain, or other conditions you have that may increase your stress or prevent you from lying still. What are the risks? Generally, this is a safe procedure. However, problems may occur, including:  Bleeding.  Infection.  Allergic reactions to medicines or dyes.  Damage to other structures or organs.  Kidney damage from the contrast dye that is used.  Increased risk of cancer from radiation exposure. This risk is low. Talk with your health care provider about: ? The risks and benefits of testing. ? How you can receive the lowest dose of radiation. What happens before the procedure?  Wear comfortable clothing and remove any jewelry, glasses, dentures, and hearing aids.  Follow instructions from your health care provider about eating and drinking. This may include: ? For 12 hours before the procedure -- avoid caffeine. This includes tea, coffee, soda, energy drinks, and diet pills. Drink plenty of water or other fluids that do not have caffeine in them. Being well hydrated can prevent complications. ? For 4-6 hours before the procedure -- stop eating and drinking. The contrast dye can cause nausea, but this is less likely if your stomach is  empty.  Ask your health care provider about changing or stopping your regular medicines. This is especially important if you are taking diabetes medicines, blood thinners, or medicines to treat problems with erections (erectile dysfunction). What happens during the procedure?   Hair on your chest may need to be removed so that small sticky patches called electrodes can be placed on your chest. These will transmit information that helps to monitor your heart during the procedure.  An IV will be inserted into one of your veins.  You might be given a medicine to control your heart rate during the procedure. This will help to ensure that good images are obtained.  You will be asked to lie on an exam table. This table will slide in and out of the CT machine during the procedure.  Contrast dye will be injected into the IV. You might feel warm, or you may get a metallic taste in your mouth.  You will be given a medicine called nitroglycerin. This will relax or dilate the arteries in your heart.  The table that you are lying on will  move into the CT machine tunnel for the scan.  The person running the machine will give you instructions while the scans are being done. You may be asked to: ? Keep your arms above your head. ? Hold your breath. ? Stay very still, even if the table is moving.  When the scanning is complete, you will be moved out of the machine.  The IV will be removed. The procedure may vary among health care providers and hospitals. What can I expect after the procedure? After your procedure, it is common to have:  A metallic taste in your mouth from the contrast dye.  A feeling of warmth.  A headache from the nitroglycerin. Follow these instructions at home:  Take over-the-counter and prescription medicines only as told by your health care provider.  If you are told, drink enough fluid to keep your urine pale yellow. This will help to flush the contrast dye out of your  body.  Most people can return to their normal activities right after the procedure. Ask your health care provider what activities are safe for you.  It is up to you to get the results of your procedure. Ask your health care provider, or the department that is doing the procedure, when your results will be ready.  Keep all follow-up visits as told by your health care provider. This is important. Contact a health care provider if:  You have any symptoms of allergy to the contrast dye. These include: ? Shortness of breath. ? Rash or hives. ? A racing heartbeat. Summary  A cardiac CT angiogram is a procedure to look at the heart and the area around the heart. It may be done to help find the cause of chest pains or other symptoms of heart disease.  During this procedure, a large X-ray machine, called a CT scanner, takes detailed pictures of the heart and the surrounding area after a contrast dye has been injected into blood vessels in the area.  Ask your health care provider about changing or stopping your regular medicines before the procedure. This is especially important if you are taking diabetes medicines, blood thinners, or medicines to treat erectile dysfunction.  If you are told, drink enough fluid to keep your urine pale yellow. This will help to flush the contrast dye out of your body. This information is not intended to replace advice given to you by your health care provider. Make sure you discuss any questions you have with your health care provider. Document Revised: 04/18/2019 Document Reviewed: 04/18/2019 Elsevier Patient Education  [place].               Adopting a Healthy Lifestyle.  Know what a healthy weight is for you (roughly BMI <25) and aim to maintain this   Aim for 7+ servings of fruits and vegetables daily   65-80+ fluid ounces of water or unsweet tea for healthy kidneys   Limit to max 1 drink of alcohol per day; avoid smoking/tobacco    Limit animal fats in diet for cholesterol and heart health - choose grass fed whenever available   Avoid highly processed foods, and foods high in saturated/trans fats   Aim for low stress - take time to unwind and care for your mental health   Aim for 150 min of moderate intensity exercise weekly for heart health, and weights twice weekly for bone health   Aim for 7-9 hours of sleep daily   When it comes to diets, agreement about  the perfect plan isnt easy to find, even among the experts. Experts at the [employer] developed an idea known as the Healthy Eating Plate. Just imagine a plate divided into logical, healthy portions.   The emphasis is on diet quality:   Load up on vegetables and fruits - one-half of your plate: Aim for color and variety, and remember that potatoes dont count.   Go for whole grains - one-quarter of your plate: Whole wheat, barley, wheat berries, quinoa, oats, brown rice, and foods made with them. If you want pasta, go with whole wheat pasta.   Protein power - one-quarter of your plate: Fish, chicken, beans, and nuts are all healthy, versatile protein sources. Limit red meat.   The diet, however, does go beyond the plate, offering a few other suggestions.   Use healthy plant oils, such as olive, canola, soy, corn, sunflower and peanut. Check the labels, and avoid partially hydrogenated oil, which have unhealthy trans fats.   If youre thirsty, drink water. Coffee and tea are good in moderation, but skip sugary drinks and limit milk and dairy products to one or two daily servings.   The type of carbohydrate in the diet is more important than the amount. Some sources of carbohydrates, such as vegetables, fruits, whole grains, and beans-are healthier than others.   Finally, stay active  Signed, Berniece Salines, DO  06/18/2020 5:45 PM    [place]

## 2020-06-18 NOTE — Patient Instructions (Addendum)
Medication Instructions:  Your physician recommends that you continue on your current medications as directed. Please refer to the Current Medication list given to you today.  *If you need a refill on your cardiac medications before your next appointment, please call your pharmacy*   Lab Work: None.  If you have labs (blood work) drawn today and your tests are completely normal, you will receive your results only by: Marland Kitchen MyChart Message (if you have MyChart) OR . A paper copy in the mail If you have any lab test that is abnormal or we need to change your treatment, we will call you to review the results.   Testing/Procedures: Your physician has requested that you have an echocardiogram. Echocardiography is a painless test that uses sound waves to create images of your heart. It provides your doctor with information about the size and shape of your heart and how well your heart's chambers and valves are working. This procedure takes approximately one hour. There are no restrictions for this procedure.  A zio monitor was ordered today. It will remain on for 14 days. You will then return monitor and event diary in provided box. It takes 1-2 weeks for report to be downloaded and returned to [place]. We will call you with the results. If monitor falls off or has orange flashing light, please call Zio for further instructions.     Your cardiac CT will be scheduled at one of the below locations:   [facility] [address] [place], [place] [address] [phone]  OR  [facility] [address] [place], [place] [address] [phone]  If scheduled at [facility], please arrive at the [facility] main entrance of [facility] 30 minutes prior to test start time. Proceed to the [facility] Radiology Department (first floor) to check-in and test prep.  If scheduled at [facility], please arrive 15  mins early for check-in and test prep.  Please follow these instructions carefully (unless otherwise directed):  Hold all erectile dysfunction medications at least 3 days (72 hrs) prior to test.  On the Night Before the Test: . Be sure to Drink plenty of water. . Do not consume any caffeinated/decaffeinated beverages or chocolate 12 hours prior to your test. . Do not take any antihistamines 12 hours prior to your test.   On the Day of the Test: . Drink plenty of water. Do not drink any water within one hour of the test. . Do not eat any food 4 hours prior to the test. . You may take your regular medications prior to the test.  . Take metoprolol (Lopressor) two hours prior to test.           After the Test: . Drink plenty of water. . After receiving IV contrast, you may experience a mild flushed feeling. This is normal. . On occasion, you may experience a mild rash up to 24 hours after the test. This is not dangerous. If this occurs, you can take Benadryl 25 mg and increase your fluid intake. . If you experience trouble breathing, this can be serious. If it is severe call 911 IMMEDIATELY. If it is mild, please call our office. . If you take any of these medications: Glipizide/Metformin, Avandament, Glucavance, please do not take 48 hours after completing test unless otherwise instructed.   Once we have confirmed authorization from your insurance company, we will call you to set up a date and  time for your test. Based on how quickly your insurance processes prior authorizations requests, please allow up to 4 weeks to be contacted for scheduling your Cardiac CT appointment. Be advised that routine Cardiac CT appointments could be scheduled as many as 8 weeks after your provider has ordered it.  For non-scheduling related questions, please contact the cardiac imaging nurse navigator should you have any questions/concerns: Marchia Bond, Cardiac Imaging Nurse Navigator Burley Saver, Interim  Cardiac Imaging Nurse [facility] Direct Office Dial: [phone]   For scheduling needs, including cancellations and rescheduling, please call Vivien Rota at [phone], option [phone].       Follow-Up: At [facility], you and your health needs are our priority.  As part of our continuing mission to provide you with exceptional heart care, we have created designated Provider Care Teams.  These Care Teams include your primary Cardiologist (physician) and Advanced Practice Providers (APPs -  Physician Assistants and Nurse Practitioners) who all work together to provide you with the care you need, when you need it.  We recommend signing up for the patient portal called "MyChart".  Sign up information is provided on this After Visit Summary.  MyChart is used to connect with patients for Virtual Visits (Telemedicine).  Patients are able to view lab/test results, encounter notes, upcoming appointments, etc.  Non-urgent messages can be sent to your provider as well.   To learn more about what you can do with MyChart, go to [email or web address].    Your next appointment:   3 month(s)  The format for your next appointment:   In Person  Provider:   Berniece Salines, DO   Other Instructions   Echocardiogram An echocardiogram is a procedure that uses painless sound waves (ultrasound) to produce an image of the heart. Images from an echocardiogram can provide important information about:  Signs of coronary artery disease (CAD).  Aneurysm detection. An aneurysm is a weak or damaged part of an artery wall that bulges out from the normal force of blood pumping through the body.  Heart size and shape. Changes in the size or shape of the heart can be associated with certain conditions, including heart failure, aneurysm, and CAD.  Heart muscle function.  Heart valve function.  Signs of a past heart attack.  Fluid buildup around the heart.  Thickening of the  heart muscle.  A tumor or infectious growth around the heart valves. Tell a health care provider about:  Any allergies you have.  All medicines you are taking, including vitamins, herbs, eye drops, creams, and over-the-counter medicines.  Any blood disorders you have.  Any surgeries you have had.  Any medical conditions you have.  Whether you are pregnant or may be pregnant. What are the risks? Generally, this is a safe procedure. However, problems may occur, including:  Allergic reaction to dye (contrast) that may be used during the procedure. What happens before the procedure? No specific preparation is needed. You may eat and drink normally. What happens during the procedure?   An IV tube may be inserted into one of your veins.  You may receive contrast through this tube. A contrast is an injection that improves the quality of the pictures from your heart.  A gel will be applied to your chest.  A wand-like tool (transducer) will be moved over your chest. The gel will help to transmit the sound waves from the transducer.  The sound waves will harmlessly bounce off of your heart to allow  the heart images to be captured in real-time motion. The images will be recorded on a computer. The procedure may vary among health care providers and hospitals. What happens after the procedure?  You may return to your normal, everyday life, including diet, activities, and medicines, unless your health care provider tells you not to do that. Summary  An echocardiogram is a procedure that uses painless sound waves (ultrasound) to produce an image of the heart.  Images from an echocardiogram can provide important information about the size and shape of your heart, heart muscle function, heart valve function, and fluid buildup around your heart.  You do not need to do anything to prepare before this procedure. You may eat and drink normally.  After the echocardiogram is completed, you may  return to your normal, everyday life, unless your health care provider tells you not to do that. This information is not intended to replace advice given to you by your health care provider. Make sure you discuss any questions you have with your health care provider. Document Revised: 12/14/2018 Document Reviewed: 09/25/2016 Elsevier Patient Education  [place].   Cardiac CT Angiogram A cardiac CT angiogram is a procedure to look at the heart and the area around the heart. It may be done to help find the cause of chest pains or other symptoms of heart disease. During this procedure, a substance called contrast dye is injected into the blood vessels in the area to be checked. A large X-ray machine, called a CT scanner, then takes detailed pictures of the heart and the surrounding area. The procedure is also sometimes called a coronary CT angiogram, coronary artery scanning, or CTA. A cardiac CT angiogram allows the health care provider to see how well blood is flowing to and from the heart. The health care provider will be able to see if there are any problems, such as:  Blockage or narrowing of the coronary arteries in the heart.  Fluid around the heart.  Signs of weakness or disease in the muscles, valves, and tissues of the heart. Tell a health care provider about:  Any allergies you have. This is especially important if you have had a previous allergic reaction to contrast dye.  All medicines you are taking, including vitamins, herbs, eye drops, creams, and over-the-counter medicines.  Any blood disorders you have.  Any surgeries you have had.  Any medical conditions you have.  Whether you are pregnant or may be pregnant.  Any anxiety disorders, chronic pain, or other conditions you have that may increase your stress or prevent you from lying still. What are the risks? Generally, this is a safe procedure. However, problems may occur,  including:  Bleeding.  Infection.  Allergic reactions to medicines or dyes.  Damage to other structures or organs.  Kidney damage from the contrast dye that is used.  Increased risk of cancer from radiation exposure. This risk is low. Talk with your health care provider about: ? The risks and benefits of testing. ? How you can receive the lowest dose of radiation. What happens before the procedure?  Wear comfortable clothing and remove any jewelry, glasses, dentures, and hearing aids.  Follow instructions from your health care provider about eating and drinking. This may include: ? For 12 hours before the procedure -- avoid caffeine. This includes tea, coffee, soda, energy drinks, and diet pills. Drink plenty of water or other fluids that do not have caffeine in them. Being well hydrated can prevent complications. ?  For 4-6 hours before the procedure -- stop eating and drinking. The contrast dye can cause nausea, but this is less likely if your stomach is empty.  Ask your health care provider about changing or stopping your regular medicines. This is especially important if you are taking diabetes medicines, blood thinners, or medicines to treat problems with erections (erectile dysfunction). What happens during the procedure?   Hair on your chest may need to be removed so that small sticky patches called electrodes can be placed on your chest. These will transmit information that helps to monitor your heart during the procedure.  An IV will be inserted into one of your veins.  You might be given a medicine to control your heart rate during the procedure. This will help to ensure that good images are obtained.  You will be asked to lie on an exam table. This table will slide in and out of the CT machine during the procedure.  Contrast dye will be injected into the IV. You might feel warm, or you may get a metallic taste in your mouth.  You will be given a medicine called  nitroglycerin. This will relax or dilate the arteries in your heart.  The table that you are lying on will move into the CT machine tunnel for the scan.  The person running the machine will give you instructions while the scans are being done. You may be asked to: ? Keep your arms above your head. ? Hold your breath. ? Stay very still, even if the table is moving.  When the scanning is complete, you will be moved out of the machine.  The IV will be removed. The procedure may vary among health care providers and hospitals. What can I expect after the procedure? After your procedure, it is common to have:  A metallic taste in your mouth from the contrast dye.  A feeling of warmth.  A headache from the nitroglycerin. Follow these instructions at home:  Take over-the-counter and prescription medicines only as told by your health care provider.  If you are told, drink enough fluid to keep your urine pale yellow. This will help to flush the contrast dye out of your body.  Most people can return to their normal activities right after the procedure. Ask your health care provider what activities are safe for you.  It is up to you to get the results of your procedure. Ask your health care provider, or the department that is doing the procedure, when your results will be ready.  Keep all follow-up visits as told by your health care provider. This is important. Contact a health care provider if:  You have any symptoms of allergy to the contrast dye. These include: ? Shortness of breath. ? Rash or hives. ? A racing heartbeat. Summary  A cardiac CT angiogram is a procedure to look at the heart and the area around the heart. It may be done to help find the cause of chest pains or other symptoms of heart disease.  During this procedure, a large X-ray machine, called a CT scanner, takes detailed pictures of the heart and the surrounding area after a contrast dye has been injected into blood  vessels in the area.  Ask your health care provider about changing or stopping your regular medicines before the procedure. This is especially important if you are taking diabetes medicines, blood thinners, or medicines to treat erectile dysfunction.  If you are told, drink enough fluid to keep your  urine pale yellow. This will help to flush the contrast dye out of your body. This information is not intended to replace advice given to you by your health care provider. Make sure you discuss any questions you have with your health care provider. Document Revised: 04/18/2019 Document Reviewed: 04/18/2019 Elsevier Patient Education  [place].

## 2020-06-27 ENCOUNTER — Ambulatory Visit (HOSPITAL_COMMUNITY): Payer: BC Managed Care – PPO

## 2020-07-02 ENCOUNTER — Telehealth (HOSPITAL_COMMUNITY): Payer: Self-pay | Admitting: Emergency Medicine

## 2020-07-02 NOTE — Telephone Encounter (Signed)
Attempted to call patient regarding upcoming cardiac CT appointment. °Left message on voicemail with name and callback number °Mearl Olver RN Navigator Cardiac Imaging °[facility] °[phone] Office °[phone] Cell ° °

## 2020-07-03 ENCOUNTER — Ambulatory Visit (HOSPITAL_COMMUNITY): Admission: RE | Admit: 2020-07-03 | Payer: BC Managed Care – PPO | Source: Ambulatory Visit

## 2020-07-07 ENCOUNTER — Telehealth (HOSPITAL_COMMUNITY): Payer: Self-pay | Admitting: Emergency Medicine

## 2020-07-07 NOTE — Telephone Encounter (Signed)
Attempted to call patient regarding upcoming cardiac CT appointment. °Left message on voicemail with name and callback number °Dyon Rotert RN Navigator Cardiac Imaging °[facility] °[phone] Office °[phone] Cell ° °

## 2020-07-08 ENCOUNTER — Encounter (HOSPITAL_COMMUNITY): Payer: Self-pay

## 2020-07-08 ENCOUNTER — Ambulatory Visit (HOSPITAL_COMMUNITY)
Admission: RE | Admit: 2020-07-08 | Discharge: 2020-07-08 | Disposition: A | Discharge status: Home / Self Care | Payer: BC Managed Care – PPO | Source: Ambulatory Visit | Attending: Cardiology | Admitting: Cardiology

## 2020-07-08 ENCOUNTER — Other Ambulatory Visit: Payer: Self-pay

## 2020-07-08 DIAGNOSIS — Z720 Tobacco use: Secondary | ICD-10-CM

## 2020-07-08 DIAGNOSIS — R002 Palpitations: Secondary | ICD-10-CM | POA: Diagnosis not present

## 2020-07-08 DIAGNOSIS — R079 Chest pain, unspecified: Secondary | ICD-10-CM | POA: Insufficient documentation

## 2020-07-08 DIAGNOSIS — R0602 Shortness of breath: Secondary | ICD-10-CM | POA: Diagnosis not present

## 2020-07-08 MED ORDER — NITROGLYCERIN 0.4 MG SL SUBL: 0.8000 mg | SUBLINGUAL_TABLET | Freq: Once | SUBLINGUAL | Status: DC

## 2020-07-08 MED ORDER — DILTIAZEM HCL 25 MG/5ML IV SOLN
5.0000 mg | Freq: Once | INTRAVENOUS | Status: AC
  Administered 2020-07-08: 5 mg via INTRAVENOUS
  Filled 2020-07-08: qty 5

## 2020-07-08 MED ORDER — METOPROLOL TARTRATE 5 MG/5ML IV SOLN
10.0000 mg | INTRAVENOUS | Status: DC | PRN
  Administered 2020-07-08: 10 mg via INTRAVENOUS

## 2020-07-08 MED ORDER — DILTIAZEM HCL 25 MG/5ML IV SOLN
INTRAVENOUS | Status: AC
  Filled 2020-07-08: qty 5

## 2020-07-08 MED ORDER — METOPROLOL TARTRATE 5 MG/5ML IV SOLN
INTRAVENOUS | Status: AC
  Filled 2020-07-08: qty 20

## 2020-07-08 NOTE — Progress Notes (Signed)
Called Dr. Servando Salina related patients  Heart rate and BP/  Pt had 10 of Metoprolol and 5mg  of cardizem,  We will reschedule per Dr. 

## 2020-07-09 ENCOUNTER — Ambulatory Visit (HOSPITAL_BASED_OUTPATIENT_CLINIC_OR_DEPARTMENT_OTHER): Payer: BC Managed Care – PPO | Attending: Cardiology

## 2020-07-14 ENCOUNTER — Telehealth: Payer: Self-pay | Admitting: General Practice

## 2020-07-14 NOTE — Telephone Encounter (Signed)
-----   Message from Thomasene Ripple, DO sent at 07/12/2020 12:41 PM EDT ----- Monitor is normal with no evidence of arrhythmia.

## 2020-07-29 ENCOUNTER — Telehealth (HOSPITAL_COMMUNITY): Payer: Self-pay | Admitting: Emergency Medicine

## 2020-07-29 NOTE — Telephone Encounter (Signed)
Attempted to call patient regarding upcoming cardiac CT appointment. °Left message on voicemail with name and callback number °Jousha Schwandt RN Navigator Cardiac Imaging ° [facility] °[phone] Office °[phone] Cell ° °

## 2020-07-30 ENCOUNTER — Telehealth (HOSPITAL_COMMUNITY): Payer: Self-pay | Admitting: Emergency Medicine

## 2020-07-30 ENCOUNTER — Telehealth: Payer: Self-pay | Admitting: *Deleted

## 2020-07-30 NOTE — Telephone Encounter (Signed)
Attempted to call patient regarding upcoming cardiac CT appointment. °Left message on voicemail with name and callback number °Mayme Profeta RN Navigator Cardiac Imaging °[facility] °[phone] Office °[phone] Cell ° °

## 2020-07-30 NOTE — Telephone Encounter (Signed)
Per request from [facility] faxed last office note by Dr. Servando Salina and ekg done here in office to further assist claim with [facility]

## 2020-08-01 ENCOUNTER — Ambulatory Visit (HOSPITAL_COMMUNITY)
Admission: RE | Admit: 2020-08-01 | Discharge: 2020-08-01 | Disposition: A | Discharge status: Home / Self Care | Payer: BC Managed Care – PPO | Source: Ambulatory Visit | Attending: Cardiology | Admitting: Cardiology

## 2020-08-01 NOTE — Progress Notes (Signed)
Patient did not show up for 745 appointment. Will have to reschedule. Front desk notified

## 2020-09-17 ENCOUNTER — Telehealth (HOSPITAL_COMMUNITY): Payer: Self-pay | Admitting: Emergency Medicine

## 2020-09-17 NOTE — Telephone Encounter (Signed)
Attempted to call patient regarding upcoming cardiac CT appointment. °Left message on voicemail with name and callback number °Nghia Mcentee RN Navigator Cardiac Imaging °[facility] °[phone] Office °[phone] Cell ° °

## 2020-09-18 ENCOUNTER — Ambulatory Visit (HOSPITAL_COMMUNITY): Admission: RE | Admit: 2020-09-18 | Payer: BC Managed Care – PPO | Source: Ambulatory Visit

## 2020-09-22 ENCOUNTER — Ambulatory Visit: Payer: BC Managed Care – PPO | Admitting: Cardiology

## 2020-10-13 DIAGNOSIS — J9819 Other pulmonary collapse: Secondary | ICD-10-CM | POA: Insufficient documentation

## 2020-10-14 ENCOUNTER — Ambulatory Visit: Payer: BC Managed Care – PPO | Admitting: Cardiology

## 2021-03-18 ENCOUNTER — Encounter: Payer: BC Managed Care – PPO | Admitting: Medical

## 2021-04-07 ENCOUNTER — Encounter: Payer: BC Managed Care – PPO | Admitting: Medical

## 2022-05-22 DIAGNOSIS — F17200 Nicotine dependence, unspecified, uncomplicated: Secondary | ICD-10-CM | POA: Diagnosis not present

## 2022-05-22 DIAGNOSIS — K219 Gastro-esophageal reflux disease without esophagitis: Secondary | ICD-10-CM | POA: Diagnosis not present

## 2022-05-22 DIAGNOSIS — Z20822 Contact with and (suspected) exposure to covid-19: Secondary | ICD-10-CM | POA: Diagnosis not present

## 2022-12-12 DIAGNOSIS — S39001A Unspecified injury of muscle, fascia and tendon of abdomen, initial encounter: Secondary | ICD-10-CM | POA: Diagnosis not present

## 2022-12-12 DIAGNOSIS — T148XXA Other injury of unspecified body region, initial encounter: Secondary | ICD-10-CM | POA: Diagnosis not present

## 2022-12-12 DIAGNOSIS — R071 Chest pain on breathing: Secondary | ICD-10-CM | POA: Diagnosis not present

## 2022-12-12 DIAGNOSIS — K2100 Gastro-esophageal reflux disease with esophagitis, without bleeding: Secondary | ICD-10-CM | POA: Diagnosis not present

## 2022-12-20 ENCOUNTER — Encounter: Payer: Self-pay | Admitting: *Deleted

## 2023-02-08 DIAGNOSIS — R079 Chest pain, unspecified: Secondary | ICD-10-CM | POA: Diagnosis not present

## 2023-02-08 DIAGNOSIS — X58XXXA Exposure to other specified factors, initial encounter: Secondary | ICD-10-CM | POA: Diagnosis not present

## 2023-02-08 DIAGNOSIS — S39012A Strain of muscle, fascia and tendon of lower back, initial encounter: Secondary | ICD-10-CM | POA: Diagnosis not present

## 2023-02-08 DIAGNOSIS — R109 Unspecified abdominal pain: Secondary | ICD-10-CM | POA: Diagnosis not present

## 2023-02-08 DIAGNOSIS — J984 Other disorders of lung: Secondary | ICD-10-CM | POA: Diagnosis not present

## 2023-02-08 DIAGNOSIS — D72829 Elevated white blood cell count, unspecified: Secondary | ICD-10-CM | POA: Diagnosis not present

## 2023-02-08 DIAGNOSIS — Z8709 Personal history of other diseases of the respiratory system: Secondary | ICD-10-CM | POA: Diagnosis not present

## 2023-02-08 DIAGNOSIS — M545 Low back pain, unspecified: Secondary | ICD-10-CM | POA: Diagnosis not present

## 2023-02-08 DIAGNOSIS — F17210 Nicotine dependence, cigarettes, uncomplicated: Secondary | ICD-10-CM | POA: Diagnosis not present

## 2023-02-08 DIAGNOSIS — R0789 Other chest pain: Secondary | ICD-10-CM | POA: Diagnosis not present

## 2023-02-08 DIAGNOSIS — M546 Pain in thoracic spine: Secondary | ICD-10-CM | POA: Diagnosis not present

## 2023-02-14 ENCOUNTER — Ambulatory Visit (INDEPENDENT_AMBULATORY_CARE_PROVIDER_SITE_OTHER): Payer: BC Managed Care – PPO | Admitting: Medical

## 2023-02-14 VITALS — BP 129/84 | HR 97 | Resp 18 | Ht 70.0 in | Wt 194.0 lb

## 2023-02-14 DIAGNOSIS — Z23 Encounter for immunization: Secondary | ICD-10-CM

## 2023-02-14 DIAGNOSIS — R739 Hyperglycemia, unspecified: Secondary | ICD-10-CM | POA: Diagnosis not present

## 2023-02-14 DIAGNOSIS — F1010 Alcohol abuse, uncomplicated: Secondary | ICD-10-CM | POA: Diagnosis not present

## 2023-02-14 DIAGNOSIS — Z0000 Encounter for general adult medical examination without abnormal findings: Secondary | ICD-10-CM

## 2023-02-14 DIAGNOSIS — F17200 Nicotine dependence, unspecified, uncomplicated: Secondary | ICD-10-CM | POA: Diagnosis not present

## 2023-02-14 MED ORDER — BUPROPION HCL ER (XL) 150 MG PO TB24: 150.0000 mg | ORAL_TABLET | Freq: Every day | ORAL | 1 refills | Status: AC

## 2023-02-14 NOTE — Progress Notes (Signed)
Subjective:    Patient ID: Keith Morse, male    DOB: Apr 15, 1991, 32 y.o.   MRN: [id number]  HPI  Pt here for wellness exam.  Last wellness exam 3 years ago.  Pt works [employer]. No exercise apart from work. Admits does not eat healthy. 2-20 oz sodas a day.(mountain dew). Pack a day. Smoked for 14 years. Alcohol- 6 pack almost 3-4 days a week.    Since pt last seen by myself . Saw cardiologist 06/18/2020  1. Palpitations   2. Shortness of breath   3. Chest pain of uncertain etiology   4. Tobacco use   5. Alcohol dependence with unspecified alcohol-induced disorder (HCC)     Pt states his work up was negative. No recurrent chest pain.   Review of Systems  Constitutional:  Negative for chills, fatigue and fever.  Respiratory:  Negative for cough, chest tightness, shortness of breath and wheezing.   Cardiovascular:  Negative for chest pain and palpitations.  Gastrointestinal:  Negative for abdominal pain.  Genitourinary:  Negative for dysuria, flank pain, frequency and hematuria.  Musculoskeletal:  Negative for back pain.  Neurological:  Negative for syncope, facial asymmetry and numbness.  Hematological:  Negative for adenopathy. Does not bruise/bleed easily.  Psychiatric/Behavioral:  Negative for confusion, dysphoric mood and suicidal ideas. The patient is not nervous/anxious.     Past Medical History:  Diagnosis Date   Collapsed lung      Social History   Socioeconomic History   Marital status: Single    Spouse name: Not on file   Number of children: Not on file   Years of education: Not on file   Highest education level: Not on file  Occupational History   Not on file  Tobacco Use   Smoking status: Every Day    Types: Cigarettes   Smokeless tobacco: Never  Vaping Use   Vaping Use: Never used  Substance and Sexual Activity   Alcohol use: Yes    Comment: weekly   Drug use: Not Currently   Sexual activity: Not on file  Other Topics Concern   Not  on file  Social History Narrative   Not on file   Social Determinants of Health   Financial Resource Strain: Not on file  Food Insecurity: Not on file  Transportation Needs: Not on file  Physical Activity: Not on file  Stress: Not on file  Social Connections: Not on file  Intimate Partner Violence: Not on file    Past Surgical History:  Procedure Laterality Date   CHEST TUBE INSERTION      No family history on file.  No Known Allergies  Current Outpatient Medications on File Prior to Visit  Medication Sig Dispense Refill   buPROPion (WELLBUTRIN XL) 150 MG 24 hr tablet Take 1 tablet (150 mg total) by mouth daily. (Patient not taking: Reported on 06/18/2020) 30 tablet 3   No current facility-administered medications on file prior to visit.    BP 129/84 (BP Location: Right Arm, Patient Position: Sitting)   Pulse 97   Resp 18   Ht 5\' 10"  (1.778 m)   Wt 194 lb (88 kg)   SpO2 92%   BMI 27.84 kg/m        Objective:   Physical Exam   General Mental Status- Alert. General Appearance- Not in acute distress.   Skin General: Color- Normal Color. Moisture- Normal Moisture.  Neck Carotid Arteries- Normal color. Moisture- Normal Moisture. No carotid bruits. No JVD.  Chest and Lung Exam Auscultation: Breath Sounds:-Normal.  Cardiovascular Auscultation:Rythm- Regular. Murmurs & Other Heart Sounds:Auscultation of the heart reveals- No Murmurs.  Abdomen Inspection:-Inspeection Normal. Palpation/Percussion:Note:No mass. Palpation and Percussion of the abdomen reveal- Non Tender, Non Distended + BS, no rebound or guarding.   Neurologic Cranial Nerve exam:- CN III-XII intact(No nystagmus), symmetric smile. Strength:- 5/5 equal and symmetric strength both upper and lower extremities.      Assessment & Plan:   Patient Instructions  For you wellness exam today I have ordered cbc, cmp and  lipid panel.  Vaccine given today.   Recommend exercise and healthy  diet.  We will let you know lab results as they come in.  Follow up date appointment will be determined after lab review.    For smoking cessation rx wellbutrin.  Recommend cutting back from drinking alcohol. Goal to quit completely over next 2 months.      Esperanza Richters, PA-C

## 2023-02-14 NOTE — Patient Instructions (Addendum)
For you wellness exam today I have ordered cbc, cmp and  lipid panel.(Future labs fasting)  Vaccine given today.   Recommend exercise and healthy diet.  We will let you know lab results as they come in.  Follow up date appointment will be determined after lab review.    For smoking cessation rx wellbutrin.  Recommend cutting back from drinking alcohol. Goal to quit completely over next 2 months.   Preventive Care 2-34 Years Old, Male Preventive care refers to lifestyle choices and visits with your health care provider that can promote health and wellness. Preventive care visits are also called wellness exams. What can I expect for my preventive care visit? Counseling During your preventive care visit, your health care provider may ask about your: Medical history, including: Past medical problems. Family medical history. Current health, including: Emotional well-being. Home life and relationship well-being. Sexual activity. Lifestyle, including: Alcohol, nicotine or tobacco, and drug use. Access to firearms. Diet, exercise, and sleep habits. Safety issues such as seatbelt and bike helmet use. Sunscreen use. Work and work Astronomer. Physical exam Your health care provider may check your: Height and weight. These may be used to calculate your BMI (body mass index). BMI is a measurement that tells if you are at a healthy weight. Waist circumference. This measures the distance around your waistline. This measurement also tells if you are at a healthy weight and may help predict your risk of certain diseases, such as type 2 diabetes and high blood pressure. Heart rate and blood pressure. Body temperature. Skin for abnormal spots. What immunizations do I need?  Vaccines are usually given at various ages, according to a schedule. Your health care provider will recommend vaccines for you based on your age, medical history, and lifestyle or other factors, such as travel or where  you work. What tests do I need? Screening Your health care provider may recommend screening tests for certain conditions. This may include: Lipid and cholesterol levels. Diabetes screening. This is done by checking your blood sugar (glucose) after you have not eaten for a while (fasting). Hepatitis B test. Hepatitis C test. HIV (human immunodeficiency virus) test. STI (sexually transmitted infection) testing, if you are at risk. Talk with your health care provider about your test results, treatment options, and if necessary, the need for more tests. Follow these instructions at home: Eating and drinking  Eat a healthy diet that includes fresh fruits and vegetables, whole grains, lean protein, and low-fat dairy products. Drink enough fluid to keep your urine pale yellow. Take vitamin and mineral supplements as recommended by your health care provider. Do not drink alcohol if your health care provider tells you not to drink. If you drink alcohol: Limit how much you have to 0-2 drinks a day. Know how much alcohol is in your drink. In the [place] one drink equals one 12 oz bottle of beer (355 mL), one 5 oz glass of wine (148 mL), or one 1 oz glass of hard liquor (44 mL). Lifestyle Brush your teeth every morning and night with fluoride toothpaste. Floss one time each day. Exercise for at least 30 minutes 5 or more days each week. Do not use any products that contain nicotine or tobacco. These products include cigarettes, chewing tobacco, and vaping devices, such as e-cigarettes. If you need help quitting, ask your health care provider. Do not use drugs. If you are sexually active, practice safe sex. Use a condom or other form of protection to prevent STIs. Find healthy  ways to manage stress, such as: Meditation, yoga, or listening to music. Journaling. Talking to a trusted person. Spending time with friends and family. Minimize exposure to UV radiation to reduce your risk of skin  cancer. Safety Always wear your seat belt while driving or riding in a vehicle. Do not drive: If you have been drinking alcohol. Do not ride with someone who has been drinking. If you have been using any mind-altering substances or drugs. While texting. When you are tired or distracted. Wear a helmet and other protective equipment during sports activities. If you have firearms in your house, make sure you follow all gun safety procedures. Seek help if you have been physically or sexually abused. What's next? Go to your health care provider once a year for an annual wellness visit. Ask your health care provider how often you should have your eyes and teeth checked. Stay up to date on all vaccines. This information is not intended to replace advice given to you by your health care provider. Make sure you discuss any questions you have with your health care provider. Document Revised: 02/18/2021 Document Reviewed: 02/18/2021 Elsevier Patient Education  2024 [employer].

## 2024-01-16 DIAGNOSIS — T24202A Burn of second degree of unspecified site of left lower limb, except ankle and foot, initial encounter: Secondary | ICD-10-CM | POA: Diagnosis not present

## 2024-01-16 DIAGNOSIS — L03116 Cellulitis of left lower limb: Secondary | ICD-10-CM | POA: Diagnosis not present

## 2024-01-20 ENCOUNTER — Ambulatory Visit: Payer: Self-pay

## 2024-01-20 DIAGNOSIS — K2920 Alcoholic gastritis without bleeding: Secondary | ICD-10-CM | POA: Diagnosis not present

## 2024-01-20 DIAGNOSIS — R1010 Upper abdominal pain, unspecified: Secondary | ICD-10-CM | POA: Diagnosis not present

## 2024-01-20 DIAGNOSIS — F1010 Alcohol abuse, uncomplicated: Secondary | ICD-10-CM | POA: Diagnosis not present

## 2024-01-20 DIAGNOSIS — F17210 Nicotine dependence, cigarettes, uncomplicated: Secondary | ICD-10-CM | POA: Diagnosis not present

## 2024-01-20 DIAGNOSIS — K760 Fatty (change of) liver, not elsewhere classified: Secondary | ICD-10-CM | POA: Diagnosis not present

## 2024-01-20 DIAGNOSIS — K2900 Acute gastritis without bleeding: Secondary | ICD-10-CM | POA: Diagnosis not present

## 2024-01-20 DIAGNOSIS — R0602 Shortness of breath: Secondary | ICD-10-CM | POA: Diagnosis not present

## 2024-01-20 NOTE — Telephone Encounter (Signed)
 Copied from CRM [id number]. Topic: Clinical - Red Word Triage >> Jan 20, 2024  7:53 AM [place] A wrote: [facility] that prompted transfer to Nurse Triage: Patient is short of breath, burning in lungs and pain. Noticed symptoms yesterday.  Chief Complaint: sob, cp, lung pain. HX collapsed lung Symptoms: see above Frequency: since yesterday Pertinent Negatives: Patient denies NA Disposition: [x] ED /[] Urgent Care (no appt availability in office) / [] Appointment(In office/virtual)/ []  [facility]/ [] Home Care/ [] Refused Recommended Disposition /[] Why Mobile Bus/ []  Follow-up with PCP Additional Notes: To ER; pcp office updated.   Reason for Disposition  [1] Chest pain (or "angina") comes and goes AND [2] is happening more often (increasing in frequency) or getting worse (increasing in severity)  (Exception: Chest pains that last only a few seconds.)  Answer Assessment - Initial Assessment Questions 1. LOCATION: "Where does it hurt?"       mid sternal 2. RADIATION: "Does the pain go anywhere else?" (e.g., into neck, jaw, arms, back)     back 3. ONSET: "When did the chest pain begin?" (Minutes, hours or days)      yesterday 4. PATTERN: "Does the pain come and go, or has it been constant since it started?"  "Does it get worse with exertion?"     Pain when he moves 5. DURATION: "How long does it last" (e.g., seconds, minutes, hours)     TO ER 6. SEVERITY: "How bad is the pain?"  (e.g., Scale 1-10; mild, moderate, or severe)    - MILD (1-3): doesn't interfere with normal activities     - MODERATE (4-7): interferes with normal activities or awakens from sleep    - SEVERE (8-10): excruciating pain, unable to do any normal activities       na 7. CARDIAC RISK FACTORS: "Do you have any history of heart problems or risk factors for heart disease?" (e.g., angina, prior heart attack; diabetes, high blood pressure, high cholesterol, smoker, or strong family history of heart disease)      na 8. PULMONARY RISK FACTORS: "Do you have any history of lung disease?"  (e.g., blood clots in lung, asthma, emphysema, birth control pills)     na 9. CAUSE: "What do you think is causing the chest pain?"     na 10. OTHER SYMPTOMS: "Do you have any other symptoms?" (e.g., dizziness, nausea, vomiting, sweating, fever, difficulty breathing, cough)       na 11. PREGNANCY: "Is there any chance you are pregnant?" "When was your last menstrual period?"       na  Protocols used: Chest Pain-A-AH

## 2024-03-14 DIAGNOSIS — M545 Low back pain, unspecified: Secondary | ICD-10-CM | POA: Diagnosis not present

## 2024-03-14 DIAGNOSIS — R079 Chest pain, unspecified: Secondary | ICD-10-CM | POA: Diagnosis not present

## 2024-03-14 DIAGNOSIS — F17210 Nicotine dependence, cigarettes, uncomplicated: Secondary | ICD-10-CM | POA: Diagnosis not present

## 2024-03-14 DIAGNOSIS — Z8709 Personal history of other diseases of the respiratory system: Secondary | ICD-10-CM | POA: Diagnosis not present

## 2024-03-14 DIAGNOSIS — M549 Dorsalgia, unspecified: Secondary | ICD-10-CM | POA: Diagnosis not present

## 2024-03-20 ENCOUNTER — Telehealth: Payer: Self-pay

## 2024-03-20 NOTE — Telephone Encounter (Signed)
 [place], [place] DEVONNA Dorlene Chiquita GORMAN, CMA; Deronda Suzen SAUNDERS, RN Left ED before finished. Call follow up with me please.

## 2024-03-20 NOTE — Telephone Encounter (Signed)
 Pt called and hung up in my face once I said  hello , may I speak to Keith Morse   Called back & lvm to return call and schedule an appointment

## 2024-05-21 ENCOUNTER — Ambulatory Visit: Admitting: Medical

## 2024-05-21 ENCOUNTER — Ambulatory Visit: Payer: Self-pay | Admitting: Medical

## 2024-05-21 ENCOUNTER — Ambulatory Visit (HOSPITAL_BASED_OUTPATIENT_CLINIC_OR_DEPARTMENT_OTHER)
Admission: RE | Admit: 2024-05-21 | Discharge: 2024-05-21 | Disposition: A | Discharge status: Home / Self Care | Source: Ambulatory Visit | Attending: Medical | Admitting: Medical

## 2024-05-21 VITALS — BP 120/82 | HR 85 | Temp 98.3°F | Resp 16 | Ht 70.0 in | Wt 198.2 lb

## 2024-05-21 DIAGNOSIS — R0981 Nasal congestion: Secondary | ICD-10-CM

## 2024-05-21 DIAGNOSIS — H6123 Impacted cerumen, bilateral: Secondary | ICD-10-CM | POA: Diagnosis not present

## 2024-05-21 DIAGNOSIS — Z0184 Encounter for antibody response examination: Secondary | ICD-10-CM | POA: Diagnosis not present

## 2024-05-21 DIAGNOSIS — R0781 Pleurodynia: Secondary | ICD-10-CM | POA: Diagnosis not present

## 2024-05-21 DIAGNOSIS — F1010 Alcohol abuse, uncomplicated: Secondary | ICD-10-CM

## 2024-05-21 DIAGNOSIS — J342 Deviated nasal septum: Secondary | ICD-10-CM

## 2024-05-21 DIAGNOSIS — Z0001 Encounter for general adult medical examination with abnormal findings: Secondary | ICD-10-CM | POA: Diagnosis not present

## 2024-05-21 DIAGNOSIS — F17200 Nicotine dependence, unspecified, uncomplicated: Secondary | ICD-10-CM

## 2024-05-21 DIAGNOSIS — K219 Gastro-esophageal reflux disease without esophagitis: Secondary | ICD-10-CM | POA: Diagnosis not present

## 2024-05-21 DIAGNOSIS — Z13220 Encounter for screening for lipoid disorders: Secondary | ICD-10-CM | POA: Diagnosis not present

## 2024-05-21 DIAGNOSIS — R739 Hyperglycemia, unspecified: Secondary | ICD-10-CM | POA: Diagnosis not present

## 2024-05-21 DIAGNOSIS — Z0000 Encounter for general adult medical examination without abnormal findings: Secondary | ICD-10-CM

## 2024-05-21 MED ORDER — BUPROPION HCL ER (XL) 150 MG PO TB24: 150.0000 mg | ORAL_TABLET | Freq: Every day | ORAL | 1 refills | Status: AC

## 2024-05-21 MED ORDER — OMEPRAZOLE 40 MG PO CPDR: 40.0000 mg | DELAYED_RELEASE_CAPSULE | Freq: Every day | ORAL | 3 refills | Status: AC

## 2024-05-21 NOTE — Progress Notes (Signed)
 Subjective:    Patient ID: Keith Morse, male    DOB: 03/17/1991, 33 y.o.   MRN: [id number]  HPI  Pt here for wellness exam.  Last wellness exam 3 years ago.   Pt works [employer]. No exercise apart from work. Admits does not eat healthy. 1-20 oz sodas a day.(mountain dew) Admits sweet tea. Pack a day. Smoked for 20 years. Alcohol- 2-3 mixed drinks every other day   Pt declines flu and pneumonia vaccine.    He experiences intermittent pain in the lower rib area, particularly when taking deep breaths or after smoking. This pain has been present over the past few weeks. He has a history of a right-sided pneumothorax approximately 12 to 15 years ago, which required hospitalization for one to two weeks.  He has been experiencing heartburn for the past six to seven years, with worsening symptoms over the last year or two. Over-the-counter 20 mg omeprazole  provides some relief, but it takes time to be effective. He attributes some of his symptoms to alcohol consumption.  He has a smoking history of approximately 20 years, smoking about a pack a day since he was 33 years old. He wants to quit smoking and has a prescription for Wellbutrin , which he has not yet started.  He reports occasional alcohol use, stating it has become more of a habit than a necessity. He denies experiencing withdrawal symptoms when abstaining for a few days, although he does experience cravings.  He has not received a flu vaccine recently and expresses disinterest in getting one. He was vaccinated against hepatitis B as a child, but he is unsure of his current immunity status.   Nasal congestion and associates with deviated septum.   Feels ears blocked bilaterally. Wants lavage  Review of Systems  Constitutional:  Negative for chills, fatigue and fever.  HENT:  Positive for congestion.   Respiratory:  Negative for cough, chest tightness and shortness of breath.        Rt rib pain lower rib   Cardiovascular:  Positive for chest pain. Negative for palpitations.  Gastrointestinal:  Negative for abdominal pain and blood in stool.  Genitourinary:  Negative for dysuria.  Musculoskeletal:  Negative for back pain.  Skin:  Negative for rash.  Neurological:  Negative for dizziness, seizures, weakness and headaches.  Hematological:  Negative for adenopathy.  Psychiatric/Behavioral:  Negative for behavioral problems, confusion, dysphoric mood and suicidal ideas. The patient is not nervous/anxious.    Past Medical History:  Diagnosis Date   Collapsed lung      Social History   Socioeconomic History   Marital status: Single    Spouse name: Not on file   Number of children: Not on file   Years of education: Not on file   Highest education level: Not on file  Occupational History   Not on file  Tobacco Use   Smoking status: Every Day    Types: Cigarettes   Smokeless tobacco: Never  Vaping Use   Vaping status: Never Used  Substance and Sexual Activity   Alcohol use: Yes    Comment: weekly   Drug use: Not Currently   Sexual activity: Not on file  Other Topics Concern   Not on file  Social History Narrative   Not on file   Social Drivers of Health   Financial Resource Strain: Not on file  Food Insecurity: Not on file  Transportation Needs: Not on file  Physical Activity: Not on file  Stress: Not  on file (07/13/2023)  Social Connections: Unknown (01/13/2022)   Received from [facility]   Social Network    Social Network: Not on file  Intimate Partner Violence: Not At Risk (01/20/2024)   Received from [facility]   HITS    Over the last 12 months how often did your partner physically hurt you?: Never    Over the last 12 months how often did your partner insult you or talk down to you?: Never    Over the last 12 months how often did your partner threaten you with physical harm?: Never    Over the last 12 months how often did your partner scream or curse at you?:  Never    Past Surgical History:  Procedure Laterality Date   CHEST TUBE INSERTION      No family history on file.  No Known Allergies  Current Outpatient Medications on File Prior to Visit  Medication Sig Dispense Refill   buPROPion  (WELLBUTRIN  XL) 150 MG 24 hr tablet Take 1 tablet (150 mg total) by mouth daily. (Patient not taking: Reported on 05/21/2024) 30 tablet 1   No current facility-administered medications on file prior to visit.    BP 120/82   Pulse 85   Temp 98.3 F (36.8 C) (Oral)   Resp 16   Ht 5' 10 (1.778 m)   Wt 198 lb 3.2 oz (89.9 kg)   SpO2 96%   BMI 28.44 kg/m           Objective:   Physical Exam  General Mental Status- Alert. General Appearance- Not in acute distress.   Skin General: Color- Normal Color. Moisture- Normal Moisture.  Neck  No JVD. No tracheal deviation.  Chest and Lung Exam Auscultation: Breath Sounds:-Normal.  Cardiovascular Auscultation:Rythm- Regular. Murmurs & Other Heart Sounds:Auscultation of the heart reveals- No Murmurs.  Abdomen Inspection:-Inspeection Normal. Palpation/Percussion:Note:No mass. Palpation and Percussion of the abdomen reveal- Non Tender, Non Distended + BS, no rebound or guarding.   Neurologic Cranial Nerve exam:- CN III-XII intact(No nystagmus), symmetric smile. Strength:- 5/5 equal and symmetric strength both upper and lower extremities.   Rt side thorax- faint pain on palpation lower ribs.  Heent- biltaral cerumen impaction comlettely. Post lavage wax removed compete and tm normal.  Lower ext- calf symmetric, no edema and negative homans signs.    Assessment & Plan:  For you wellness exam today I have ordered labs today. - CBC with Differential/Platelet - Comprehensive metabolic panel with GFR - Lipid panel -hep b surface antibody.   Vaccines declined  Recommend exercise and healthy diet.  We will let you know lab results as they come in.  Follow up date appointment will be  determined after lab review.      Impacted cerumen, bilateral -verbal consent given for lavage. Bilateral earwax impaction causing discomfort. Ear irrigation procedure discussed with a 90% success rate, noting it will be stopped if discomfort or dizziness occurs. - Perform ear irrigation if possible. - If unsuccessful, consider ENT referral for further management.  Deviated nasal septum and nasal congestion Previous ENT referral not followed up, patient now ready for evaluation.  Smoker(Nicotine dependence) Chronic nicotine dependence with a 20-year history of smoking one pack per day. Discussed Wellbutrin  for smoking cessation and its benefits for reducing alcohol consumption. No withdrawal symptoms during abstinence. - Prescribe Wellbutrin  150 mg daily for smoking cessation.  Gastro-esophageal reflux disease without esophagitis Chronic GERD symptoms worsening over the past year or two. Previous OTC omeprazole  provided some relief. Discussed  lifestyle modifications and higher-dose omeprazole . Alcohol identified as a contributing factor. - Prescribe omeprazole  40 mg daily. - Advise reduction of caffeinated beverages, fried foods, and alcohol.  Rt rib pain. Remote history of right-sided pneumothorax with recurrent right lower rib pain Significant pneumothorax 12-15 years ago requiring hospitalization. Recent recurrent right lower rib pain necessitates imaging. - Order chest x-ray with rib series.  Hyperglycemia, unspecified Previous mild elevation in blood sugar levels. - Order hemoglobin A1c to evaluate average blood sugar levels over the past three months.  General Health Maintenance Discussed immunization status. Declined flu vaccine. Potential loss of hepatitis B immunity due to age and time since vaccination. - Order hepatitis B surface antibody test to assess immunity. - Discussed pneumonia vaccine recommendation due to smoking history. Pt declined.  Follow up in one month or  sooner if needed   [place] Maxwell, PA-C   [id number] charge in additon to wellness as address various issues/health concerns.

## 2024-05-21 NOTE — Patient Instructions (Addendum)
 For you wellness exam today I have ordered labs today. - CBC with Differential/Platelet - Comprehensive metabolic panel with GFR - Lipid panel -hep b surface antibody.   Vaccines declined  Recommend exercise and healthy diet.  We will let you know lab results as they come in.  Follow up date appointment will be determined after lab review.      Impacted cerumen, bilateral -verbal consent given for lavage. Bilateral earwax impaction causing discomfort. Ear irrigation procedure discussed with a 90% success rate, noting it will be stopped if discomfort or dizziness occurs. - Perform ear irrigation if possible. - If unsuccessful, consider ENT referral for further management. -post lavage wax removed completely. Tm intact/nml bilaterally.   Deviated nasal septum and nasal congestion Previous ENT referral not followed up, patient now ready for evaluation.  Smoker(Nicotine dependence) Chronic nicotine dependence with a 20-year history of smoking one pack per day. Discussed Wellbutrin  for smoking cessation and its benefits for reducing alcohol consumption. No withdrawal symptoms during abstinence. - Prescribe Wellbutrin  150 mg daily for smoking cessation.  Gastro-esophageal reflux disease without esophagitis Chronic GERD symptoms worsening over the past year or two. Previous OTC omeprazole  provided some relief. Discussed lifestyle modifications and higher-dose omeprazole . Alcohol identified as a contributing factor. - Prescribe omeprazole  40 mg daily. - Advise reduction of caffeinated beverages, fried foods, and alcohol.  Rt rib pain. Remote history of right-sided pneumothorax with recurrent right lower rib pain Significant pneumothorax 12-15 years ago requiring hospitalization. Recent recurrent right lower rib pain necessitates imaging. - Order chest x-ray with rib series.  Hyperglycemia, unspecified Previous mild elevation in blood sugar levels. - Order hemoglobin A1c to evaluate  average blood sugar levels over the past three months.  General Health Maintenance Discussed immunization status. Declined flu vaccine. Potential loss of hepatitis B immunity due to age and time since vaccination. - Order hepatitis B surface antibody test to assess immunity. - Discussed pneumonia vaccine recommendation due to smoking history. Pt declined.  Follow up in one month or sooner if needed  Preventive Care 76-91 Years Old, Male Preventive care refers to lifestyle choices and visits with your health care provider that can promote health and wellness. Preventive care visits are also called wellness exams. What can I expect for my preventive care visit? Counseling During your preventive care visit, your health care provider may ask about your: Medical history, including: Past medical problems. Family medical history. Current health, including: Emotional well-being. Home life and relationship well-being. Sexual activity. Lifestyle, including: Alcohol, nicotine or tobacco, and drug use. Access to firearms. Diet, exercise, and sleep habits. Safety issues such as seatbelt and bike helmet use. Sunscreen use. Work and work Astronomer. Physical exam Your health care provider may check your: Height and weight. These may be used to calculate your BMI (body mass index). BMI is a measurement that tells if you are at a healthy weight. Waist circumference. This measures the distance around your waistline. This measurement also tells if you are at a healthy weight and may help predict your risk of certain diseases, such as type 2 diabetes and high blood pressure. Heart rate and blood pressure. Body temperature. Skin for abnormal spots. What immunizations do I need?  Vaccines are usually given at various ages, according to a schedule. Your health care provider will recommend vaccines for you based on your age, medical history, and lifestyle or other factors, such as travel or where you  work. What tests do I need? Screening Your health care provider may recommend  screening tests for certain conditions. This may include: Lipid and cholesterol levels. Diabetes screening. This is done by checking your blood sugar (glucose) after you have not eaten for a while (fasting). Hepatitis B test. Hepatitis C test. HIV (human immunodeficiency virus) test. STI (sexually transmitted infection) testing, if you are at risk. Talk with your health care provider about your test results, treatment options, and if necessary, the need for more tests. Follow these instructions at home: Eating and drinking  Eat a healthy diet that includes fresh fruits and vegetables, whole grains, lean protein, and low-fat dairy products. Drink enough fluid to keep your urine pale yellow. Take vitamin and mineral supplements as recommended by your health care provider. Do not drink alcohol if your health care provider tells you not to drink. If you drink alcohol: Limit how much you have to 0-2 drinks a day. Know how much alcohol is in your drink. In the [place] one drink equals one 12 oz bottle of beer (355 mL), one 5 oz glass of wine (148 mL), or one 1 oz glass of hard liquor (44 mL). Lifestyle Brush your teeth every morning and night with fluoride toothpaste. Floss one time each day. Exercise for at least 30 minutes 5 or more days each week. Do not use any products that contain nicotine or tobacco. These products include cigarettes, chewing tobacco, and vaping devices, such as e-cigarettes. If you need help quitting, ask your health care provider. Do not use drugs. If you are sexually active, practice safe sex. Use a condom or other form of protection to prevent STIs. Find healthy ways to manage stress, such as: Meditation, yoga, or listening to music. Journaling. Talking to a trusted person. Spending time with friends and family. Minimize exposure to UV radiation to reduce your risk of skin  cancer. Safety Always wear your seat belt while driving or riding in a vehicle. Do not drive: If you have been drinking alcohol. Do not ride with someone who has been drinking. If you have been using any mind-altering substances or drugs. While texting. When you are tired or distracted. Wear a helmet and other protective equipment during sports activities. If you have firearms in your house, make sure you follow all gun safety procedures. Seek help if you have been physically or sexually abused. What's next? Go to your health care provider once a year for an annual wellness visit. Ask your health care provider how often you should have your eyes and teeth checked. Stay up to date on all vaccines. This information is not intended to replace advice given to you by your health care provider. Make sure you discuss any questions you have with your health care provider. Document Revised: 02/18/2021 Document Reviewed: 02/18/2021 Elsevier Patient Education  2024 [employer].

## 2024-05-22 LAB — LIPID PANEL
Cholesterol: 197 mg/dL (ref 0–200)
HDL: 46.5 mg/dL (ref >39.00)
LDL Cholesterol: 120 mg/dL — ABNORMAL HIGH (ref 0–99)
NonHDL: 150.69
Total CHOL/HDL Ratio: 4
Triglycerides: 154 mg/dL — ABNORMAL HIGH (ref 0.0–149.0)
VLDL: 30.8 mg/dL (ref 0.0–40.0)

## 2024-05-22 LAB — CBC WITH DIFFERENTIAL/PLATELET
Basophils Absolute: 0.1 K/uL (ref 0.0–0.1)
Basophils Relative: 0.8 % (ref 0.0–3.0)
Eosinophils Absolute: 0.2 K/uL (ref 0.0–0.7)
Eosinophils Relative: 2.6 % (ref 0.0–5.0)
HCT: 47.4 % (ref 39.0–52.0)
Hemoglobin: 15.9 g/dL (ref 13.0–17.0)
Lymphocytes Relative: 28.7 % (ref 12.0–46.0)
Lymphs Abs: 2.8 K/uL (ref 0.7–4.0)
MCHC: 33.5 g/dL (ref 30.0–36.0)
MCV: 94.3 fl (ref 78.0–100.0)
Monocytes Absolute: 0.9 K/uL (ref 0.1–1.0)
Monocytes Relative: 9.4 % (ref 3.0–12.0)
Neutro Abs: 5.6 K/uL (ref 1.4–7.7)
Neutrophils Relative %: 58.5 % (ref 43.0–77.0)
Platelets: 252 K/uL (ref 150.0–400.0)
RBC: 5.03 Mil/uL (ref 4.22–5.81)
RDW: 13.5 % (ref 11.5–15.5)
WBC: 9.6 K/uL (ref 4.0–10.5)

## 2024-05-22 LAB — HEMOGLOBIN A1C: Hgb A1c MFr Bld: 5.5 % (ref 4.6–6.5)

## 2024-05-22 LAB — COMPREHENSIVE METABOLIC PANEL WITH GFR
ALT: 61 U/L — ABNORMAL HIGH (ref 0–53)
AST: 27 U/L (ref 0–37)
Albumin: 4.7 g/dL (ref 3.5–5.2)
Alkaline Phosphatase: 83 U/L (ref 39–117)
BUN: 16 mg/dL (ref 6–23)
CO2: 24 meq/L (ref 19–32)
Calcium: 9.9 mg/dL (ref 8.4–10.5)
Chloride: 107 meq/L (ref 96–112)
Creatinine, Ser: 0.91 mg/dL (ref 0.40–1.50)
GFR: 109.27 mL/min (ref >60.00)
Glucose, Bld: 83 mg/dL (ref 70–99)
Potassium: 4 meq/L (ref 3.5–5.1)
Sodium: 141 meq/L (ref 135–145)
Total Bilirubin: 0.3 mg/dL (ref 0.2–1.2)
Total Protein: 7.1 g/dL (ref 6.0–8.3)

## 2024-05-22 LAB — HEPATITIS B SURFACE ANTIBODY, QUANTITATIVE: Hep B S AB Quant (Post): 7 m[IU]/mL — ABNORMAL LOW (ref >=10)

## 2024-07-02 ENCOUNTER — Ambulatory Visit: Admitting: Medical

## 2024-07-02 VITALS — BP 110/78 | HR 87 | Temp 98.2°F | Resp 14 | Ht 70.0 in | Wt 202.2 lb

## 2024-07-02 DIAGNOSIS — J342 Deviated nasal septum: Secondary | ICD-10-CM

## 2024-07-02 DIAGNOSIS — K219 Gastro-esophageal reflux disease without esophagitis: Secondary | ICD-10-CM

## 2024-07-02 DIAGNOSIS — F17200 Nicotine dependence, unspecified, uncomplicated: Secondary | ICD-10-CM | POA: Diagnosis not present

## 2024-07-02 DIAGNOSIS — E785 Hyperlipidemia, unspecified: Secondary | ICD-10-CM

## 2024-07-02 DIAGNOSIS — R748 Abnormal levels of other serum enzymes: Secondary | ICD-10-CM

## 2024-07-02 NOTE — Patient Instructions (Addendum)
 Gastroesophageal Reflux Disease (GERD) Intermittent symptoms managed with omeprazole  40 mg. Symptoms improve with medication. Discussed famotidine as an alternative. - Continue omeprazole  40 mg as needed. - Consider dietary modifications. - Consider famotidine as needed.  Smoker Smoking with an attempt to quit using Wellbutrin . Advised to take Wellbutrin  for two weeks before tapering smoking. Plan to quit in three months' time frame or sooner if needed - Encourage Wellbutrin  for two weeks before reducing smoking. - Plan to quit smoking in three months. - Follow up in one month to assess progress.  Hyperlipidemia Slightly elevated triglycerides and LDL. Non-fasting state may have contributed. Advised diet and exercise. - Advise fasting before next lipid panel. - Encourage diet and exercise.  Elevated Liver Enzymes Slight elevation in ALT, possibly due to alcohol. No immediate concern for fatty liver. - Advise avoiding alcohol before future tests.  Nonimmunity to Hepatitis B Negative hepatitis B surface antibody. Discussed exposure risks and vaccination benefits. - Provide information on hepatitis B vaccination risks and benefits.  Follow up by my chart in one month with update on smoking cessation.                     Hepatitis B virus (HBV) spreads through contact with infected blood or body fluids. Risk factors include:  Unprotected sex with an infected person Sharing needles or syringes (e.g., injection drug use) Needlestick injuries (especially in healthcare settings) Tattooing or piercing with unsterile equipment Blood transfusions before 1992 (in some countries) Exposure to open sores or blood of an infected person Sharing personal items like razors or toothbrushes     The Hepatitis B vaccine is generally safe and well-tolerated, but like all vaccines, it can cause side effects. Here's a summary based on information from the [facility], [facility], and  Drugs.  ? Common Side Effects These are usually mild and temporary:  Pain, redness, or swelling at the injection site Headache Fatigue Fever Irritability or fussiness (especially in infants) Loss of appetite Muscle or joint aches   ?? Less Common or Rare Side Effects These may require medical attention:  Swollen joints Nausea or vomiting Chest discomfort or pain Sudden numbness or weakness in limbs Dizziness or fast heartbeat Skin rash, hives, or itching Difficulty breathing or swallowing Severe allergic reaction (anaphylaxis) - very rare but serious

## 2024-07-02 NOTE — Progress Notes (Signed)
   Subjective:    Patient ID: Elston Aldape, male    DOB: 1991-06-03, 33 y.o.   MRN: [id number]  HPI Antoinette Haskett is a 33 year old male who presents for a follow-up after a wellness exam.  During the recent wellness exam, his A1c was 5.5. His ALT was slightly elevated. He reported drinking alcohol a week prior to the test. His triglycerides were slightly elevated at 154 mg/dL, and LDL cholesterol was 120 mg/dL. He was not fasting before the blood work, which may have affected the cholesterol levels.  His hepatitis B surface antibody test was negative indicated lacks immunity.  He experiences heartburn and reflux symptoms, for which he was prescribed omeprazole  40 mg. It helps, especially when he consumes foods or drinks that trigger symptoms. He does not take it daily but uses it as needed.  He has a history of smoking and was prescribed Wellbutrin  to aid in cessation. He took one dose but has not continued regularly.   Review of Systems See hpi    Objective:   Physical Exam  General Mental Status- Alert. General Appearance- Not in acute distress.   Skin General: Color- Normal Color. Moisture- Normal Moisture.  Neck Carotid Arteries- Normal color. Moisture- Normal Moisture. No carotid bruits. No JVD.  Chest and Lung Exam Auscultation: Breath Sounds:-Normal.  Cardiovascular Auscultation:Rythm- Regular. Murmurs & Other Heart Sounds:Auscultation of the heart reveals- No Murmurs.  Abdomen Inspection:-Inspeection Normal. Palpation/Percussion:Note:No mass. Palpation and Percussion of the abdomen reveal- Non Tender, Non Distended + BS, no rebound or guarding.    Neurologic Cranial Nerve exam:- CN III-XII intact(No nystagmus), symmetric smile. Strength:- 5/5 equal and symmetric strength both upper and lower extremities.       Assessment & Plan:   Gastroesophageal Reflux Disease (GERD) Intermittent symptoms managed with omeprazole  40 mg. Symptoms improve with medication.  Discussed famotidine as an alternative. - Continue omeprazole  40 mg as needed. - Consider dietary modifications. - Consider famotidine as needed.  Smoker Smoking with an attempt to quit using Wellbutrin . Advised to take Wellbutrin  for two weeks before tapering smoking. Plan to quit in three months' time frame or sooner if needed - Encourage Wellbutrin  for two weeks before reducing smoking. - Plan to quit smoking in three months. - Follow up in one month to assess progress.  Hyperlipidemia Slightly elevated triglycerides and LDL. Non-fasting state may have contributed. Advised diet and exercise. - Advise fasting before next lipid panel. - Encourage diet and exercise.  Elevated Liver Enzymes Slight elevation in ALT, possibly due to alcohol. No immediate concern for fatty liver. - Advise avoiding alcohol before future tests.  Nonimmunity to Hepatitis B Negative hepatitis B surface antibody. Discussed exposure risks and vaccination benefits. - Provide information on hepatitis B vaccination risks and benefits.  Follow up by my chart in one month with update on smoking cessation.  Rockney Grenz, PA-C

## 2024-07-13 ENCOUNTER — Encounter (INDEPENDENT_AMBULATORY_CARE_PROVIDER_SITE_OTHER): Payer: Self-pay

## 2024-07-13 ENCOUNTER — Ambulatory Visit (INDEPENDENT_AMBULATORY_CARE_PROVIDER_SITE_OTHER)

## 2024-07-13 VITALS — BP 127/91 | HR 91 | Ht 70.0 in | Wt 192.0 lb

## 2024-07-13 DIAGNOSIS — S0992XS Unspecified injury of nose, sequela: Secondary | ICD-10-CM

## 2024-07-13 DIAGNOSIS — M950 Acquired deformity of nose: Secondary | ICD-10-CM | POA: Diagnosis not present

## 2024-07-13 DIAGNOSIS — J343 Hypertrophy of nasal turbinates: Secondary | ICD-10-CM

## 2024-07-13 DIAGNOSIS — S0992XD Unspecified injury of nose, subsequent encounter: Secondary | ICD-10-CM

## 2024-07-13 DIAGNOSIS — J348201 Internal nasal valve collapse, static: Secondary | ICD-10-CM | POA: Diagnosis not present

## 2024-07-13 DIAGNOSIS — Z720 Tobacco use: Secondary | ICD-10-CM

## 2024-07-13 DIAGNOSIS — J342 Deviated nasal septum: Secondary | ICD-10-CM
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
## 2024-08-19 DIAGNOSIS — R051 Acute cough: Secondary | ICD-10-CM | POA: Diagnosis not present

## 2024-08-19 DIAGNOSIS — J209 Acute bronchitis, unspecified: Secondary | ICD-10-CM | POA: Diagnosis not present

## 2024-08-19 DIAGNOSIS — J019 Acute sinusitis, unspecified: Secondary | ICD-10-CM | POA: Diagnosis not present

## 2024-09-26 ENCOUNTER — Ambulatory Visit: Admitting: Medical

## 2024-09-26 VITALS — BP 116/76 | HR 91 | Temp 98.0°F | Resp 15 | Ht 70.0 in | Wt 205.6 lb

## 2024-09-26 DIAGNOSIS — F17200 Nicotine dependence, unspecified, uncomplicated: Secondary | ICD-10-CM

## 2024-09-26 DIAGNOSIS — R0789 Other chest pain: Secondary | ICD-10-CM

## 2024-09-26 DIAGNOSIS — R0781 Pleurodynia: Secondary | ICD-10-CM | POA: Diagnosis not present

## 2024-09-26 NOTE — Patient Instructions (Signed)
 Right-sided pleuritic chest pain with rib pain Intermittent pleuritic chest pain with history of pneumothorax. Differential includes recurrent pneumothorax, rib fracture, or pneumonia. Smoking history may contribute. - Ordered chest x-ray and rib series to evaluate for recurrent pneumothorax, rib fracture, or pneumonia. - Advised to report new symptoms such as chest congestion, fever, or productive cough. - Discussed potential need for antibiotics if infection symptoms develop.  Nicotine dependence, cigarettes Long-standing nicotine dependence with significant smoking history. Discussed risks including lung cancer and chronic bronchitis. Previous offer of Wellbutrin  not initiated. Discussed CT lung cancer screening due to smoking history. - Encouraged initiation of Wellbutrin  for smoking cessation. - Discussed CT lung cancer screening options, including referral to pulmonology or self-ordering thru work clinic, considering insurance coverage. - Advised to contact insurance to confirm coverage for CT lung cancer screening at preferred location.  Follow up 3-4 weeks to assess smoking cessation status or sooner if needed

## 2024-09-26 NOTE — Progress Notes (Signed)
 "  Subjective:    Patient ID: Keith Morse, male    DOB: 12/15/1990, 34 y.o.   MRN: [id number]  HPI  Keith Morse is a 34 year old male who presents with persistent right-sided rib pain.  He has had intermittent right-sided rib pain for 3 weeks. The pain varies between pressure and sharp shooting, worsens with certain positions and activities at work, and the area is tender and feels bruised. No history of recent fall or injury.  He had a right-sided pneumothorax 12 to 15 years ago that required hospitalization and a chest tube.  He was treated at urgent care in September for suspected bronchitis without a chest x-ray and received antibiotics and prednisone. Those symptoms have resolved for most part. No fever, no chills or sweats occasional cough.  He started smoking at age 82 tand has smoked about 1 pack per day for 20 years. For the past 6 months he has smoked up to 2 packs per day, especially when drinking.  He has no calf or posterior knee pain.    Review of Systems  Constitutional:  Negative for chills, fatigue and fever.  Respiratory:  Positive for cough. Negative for chest tightness and wheezing.        Rare cough. See hpi  Cardiovascular:  Negative for chest pain and palpitations.  Gastrointestinal:  Negative for abdominal pain, nausea and vomiting.  Genitourinary:  Negative for flank pain.  Musculoskeletal:  Negative for back pain and neck pain.       See hpi.  Skin:  Negative for rash.  Neurological:  Negative for dizziness, syncope, weakness and numbness.  Hematological:  Negative for adenopathy.  Psychiatric/Behavioral:  Negative for behavioral problems and dysphoric mood.     Past Medical History:  Diagnosis Date   Collapsed lung      Social History   Socioeconomic History   Marital status: Single    Spouse name: Not on file   Number of children: Not on file   Years of education: Not on file   Highest education level: Not on file  Occupational History    Not on file  Tobacco Use   Smoking status: Every Day    Types: Cigarettes   Smokeless tobacco: Never  Vaping Use   Vaping status: Never Used  Substance and Sexual Activity   Alcohol use: Yes    Comment: weekly   Drug use: Not Currently   Sexual activity: Not on file  Other Topics Concern   Not on file  Social History Narrative   Not on file   Social Drivers of Health   Tobacco Use: High Risk (07/13/2024)   Patient History    Smoking Tobacco Use: Every Day    Smokeless Tobacco Use: Never    Passive Exposure: Not on file  Financial Resource Strain: Not on file  Food Insecurity: Not on file  Transportation Needs: Not on file  Physical Activity: Not on file  Stress: Not on file (07/13/2023)  Social Connections: Not on file  Intimate Partner Violence: Not At Risk (01/20/2024)   Received from [facility]   HITS    Over the last 12 months how often did your partner physically hurt you?: Never    Over the last 12 months how often did your partner insult you or talk down to you?: Never    Over the last 12 months how often did your partner threaten you with physical harm?: Never    Over the last 12 months how often  did your partner scream or curse at you?: Never  Depression (PHQ2-9): Low Risk (05/21/2024)   Depression (PHQ2-9)    PHQ-2 Score: 2  Alcohol Screen: Not on file  Housing: Not on file  Utilities: Not on file  Health Literacy: Not on file    Past Surgical History:  Procedure Laterality Date   CHEST TUBE INSERTION      No family history on file.  Allergies[1]  Medications Ordered Prior to Encounter[2]  BP 116/76   Pulse 91   Temp 98 F (36.7 C) (Oral)   Resp 15   Ht 5' 10 (1.778 m)   Wt 205 lb 9.6 oz (93.3 kg)   SpO2 98%   BMI 29.50 kg/m        Objective:   Physical Exam  General Mental Status- Alert. General Appearance- Not in acute distress.     Neck  No JVD. No tracheal deviation.  Chest and Lung Exam Auscultation: Breath  Sounds:-CTA  Cardiovascular Auscultation:Rythm- RRR Murmurs & Other Heart Sounds:Auscultation of the heart reveals- No Murmurs.  Abdomen Inspection:-Inspeection Normal. Palpation/Percussion:Note:No mass. Palpation and Percussion of the abdomen reveal- Non Tender, Non Distended + BS, no rebound or guarding.   Neurologic Cranial Nerve exam:- CN III-XII intact(No nystagmus), symmetric smile. ric strength both upper and lower extremities.    Anterior thorax- rt sided rib area near prior chest tube site taint tender    Assessment & Plan:   Patient Instructions  Right-sided pleuritic chest pain with rib pain Intermittent pleuritic chest pain with history of pneumothorax. Differential includes recurrent pneumothorax, rib fracture, or pneumonia. Smoking history may contribute. - Ordered chest x-ray and rib series to evaluate for recurrent pneumothorax, rib fracture, or pneumonia. - Advised to report new symptoms such as chest congestion, fever, or productive cough. - Discussed potential need for antibiotics if infection symptoms develop.  Nicotine dependence, cigarettes Long-standing nicotine dependence with significant smoking history. Discussed risks including lung cancer and chronic bronchitis. Previous offer of Wellbutrin  not initiated. Discussed CT lung cancer screening due to smoking history. - Encouraged initiation of Wellbutrin  for smoking cessation. - Discussed CT lung cancer screening options, including referral to pulmonology or self-ordering thru work clinic, considering insurance coverage. - Advised to contact insurance to confirm coverage for CT lung cancer screening at preferred location.  Follow up 3-4 weeks to assess smoking cessation status or sooner if needed   [employer], PA-C     [1] No Known Allergies [2]  Current Outpatient Medications on File Prior to Visit  Medication Sig Dispense Refill   omeprazole  (PRILOSEC) 40 MG capsule Take 1 capsule (40 mg total)  by mouth daily. 30 capsule 3   buPROPion  (WELLBUTRIN  XL) 150 MG 24 hr tablet Take 1 tablet (150 mg total) by mouth daily. (Patient not taking: Reported on 09/26/2024) 30 tablet 1   buPROPion  (WELLBUTRIN  XL) 150 MG 24 hr tablet Take 1 tablet (150 mg total) by mouth daily. (Patient not taking: Reported on 09/26/2024) 30 tablet 1   No current facility-administered medications on file prior to visit.   "

## 2024-09-27 ENCOUNTER — Ambulatory Visit: Payer: Self-pay | Admitting: Medical

## 2024-09-27 ENCOUNTER — Ambulatory Visit (HOSPITAL_BASED_OUTPATIENT_CLINIC_OR_DEPARTMENT_OTHER)
Admission: RE | Admit: 2024-09-27 | Discharge: 2024-09-27 | Disposition: A | Discharge status: Home / Self Care | Source: Ambulatory Visit | Attending: Medical | Admitting: Medical

## 2024-09-27 DIAGNOSIS — R0789 Other chest pain: Secondary | ICD-10-CM | POA: Diagnosis present

## 2024-09-27 DIAGNOSIS — R0781 Pleurodynia: Secondary | ICD-10-CM | POA: Diagnosis present

## 2024-11-30 ENCOUNTER — Encounter (INDEPENDENT_AMBULATORY_CARE_PROVIDER_SITE_OTHER)
